# Patient Record
Sex: Female | Born: 1990 | Race: Black or African American | Hispanic: No | Marital: Single | Smoking: Never smoker
Health system: Southern US, Community
[De-identification: ages and names within clinical notes are randomized; demographics above are authoritative.]

## PROBLEM LIST (undated history)

## (undated) ENCOUNTER — Inpatient Hospital Stay: Payer: Self-pay

## (undated) DIAGNOSIS — Z789 Other specified health status: Secondary | ICD-10-CM

## (undated) HISTORY — PX: FOOT SURGERY: SHX648

---
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
## 2013-12-23 ENCOUNTER — Emergency Department (HOSPITAL_COMMUNITY)
Admission: EM | Admit: 2013-12-23 | Discharge: 2013-12-23 | Disposition: A | Discharge status: Home / Self Care | Payer: Medicaid Other | Attending: Emergency Medicine | Admitting: Emergency Medicine

## 2013-12-23 ENCOUNTER — Encounter (HOSPITAL_COMMUNITY): Payer: Self-pay | Admitting: Emergency Medicine

## 2013-12-23 DIAGNOSIS — Z3202 Encounter for pregnancy test, result negative: Secondary | ICD-10-CM | POA: Insufficient documentation

## 2013-12-23 DIAGNOSIS — R51 Headache: Secondary | ICD-10-CM | POA: Diagnosis not present

## 2013-12-23 DIAGNOSIS — R519 Headache, unspecified: Secondary | ICD-10-CM

## 2013-12-23 DIAGNOSIS — G8929 Other chronic pain: Secondary | ICD-10-CM | POA: Diagnosis not present

## 2013-12-23 DIAGNOSIS — N390 Urinary tract infection, site not specified: Secondary | ICD-10-CM | POA: Diagnosis not present

## 2013-12-23 LAB — URINALYSIS, ROUTINE W REFLEX MICROSCOPIC
Bilirubin Urine: NEGATIVE
Glucose, UA: NEGATIVE mg/dL
Ketones, ur: NEGATIVE mg/dL
Nitrite: NEGATIVE
PH: 6.5 (ref 5.0–8.0)
Protein, ur: NEGATIVE mg/dL
SPECIFIC GRAVITY, URINE: 1.02 (ref 1.005–1.030)
Urobilinogen, UA: 0.2 mg/dL (ref 0.0–1.0)

## 2013-12-23 LAB — URINE MICROSCOPIC-ADD ON

## 2013-12-23 LAB — PREGNANCY, URINE: PREG TEST UR: NEGATIVE

## 2013-12-23 MED ORDER — IBUPROFEN 400 MG PO TABS
400.0000 mg | ORAL_TABLET | Freq: Once | ORAL | Status: AC
  Administered 2013-12-23: 400 mg via ORAL
  Filled 2013-12-23: qty 1

## 2013-12-23 MED ORDER — ACETAMINOPHEN 500 MG PO TABS
1000.0000 mg | ORAL_TABLET | Freq: Once | ORAL | Status: AC
  Administered 2013-12-23: 1000 mg via ORAL
  Filled 2013-12-23: qty 2

## 2013-12-23 MED ORDER — CEPHALEXIN 500 MG PO CAPS
500.0000 mg | ORAL_CAPSULE | Freq: Four times a day (QID) | ORAL | Status: DC
Start: 2013-12-23 — End: 2014-01-11

## 2013-12-23 MED ORDER — PROMETHAZINE HCL 25 MG PO TABS: 25.0000 mg | ORAL_TABLET | Freq: Four times a day (QID) | ORAL | Status: DC | PRN

## 2013-12-23 NOTE — ED Provider Notes (Signed)
CSN: [id number]     Arrival date & time 12/23/13  1304 History   First MD Initiated Contact with Patient 12/23/13 1400     Chief Complaint  Patient presents with  . Headache      HPI Per pt, c/o gradual onset and persistence of constant acute flair of her chronic headache since 1800 last night.  Describes the headache as located in her forehead, and per her usual chronic headache pain pattern for the past 1 year. Pt states her PMD "gave me some kind of sinus medicine for a while" which did not relief her headache. Denies headache was sudden or maximal in onset or at any time.  Denies visual changes, no focal motor weakness, no tingling/numbness in extremities, no fevers, no neck pain, no rash.  Per pt, c/o gradual onset and persistence of constant dysuria, urinary frequency and urgency for the past 1 week. Describes her symptoms as "I think I have a UTI." Denies flank pain, no fevers, no abd pain, no N/V/D, no vaginal bleeding/discharge, no rash.    Past Medical History  Diagnosis Date  . Chronic headache    Past Surgical History  Procedure Laterality Date  . Foot surgery Left     History  Substance Use Topics  . Smoking status: Never Smoker   . Smokeless tobacco: Not on file  . Alcohol Use: No    Review of Systems ROS: Statement: All systems negative except as marked or noted in the HPI; Constitutional: Negative for fever and chills. ; ; Eyes: Negative for eye pain, redness and discharge. ; ; ENMT: Negative for ear pain, hoarseness, sore throat, nasal congestion, sinus pressure. ; ; Cardiovascular: Negative for chest pain, palpitations, diaphoresis, dyspnea and peripheral edema. ; ; Respiratory: Negative for cough, wheezing and stridor. ; ; Gastrointestinal: Negative for nausea, vomiting, diarrhea, abdominal pain, blood in stool, hematemesis, jaundice and rectal bleeding. . ; ; Genitourinary: +urinary frequency, dysuria. Negative for flank pain and hematuria. ; ; Musculoskeletal:  Negative for back pain and neck pain. Negative for swelling and trauma.; ; Skin: Negative for pruritus, rash, abrasions, blisters, bruising and skin lesion.; ; Neuro: Negative for headache, lightheadedness and neck stiffness. Negative for weakness, altered level of consciousness , altered mental status, extremity weakness, paresthesias, involuntary movement, seizure and syncope.      Allergies  Review of patient's allergies indicates no known allergies.  Home Medications   Prior to Admission medications   Not on File   BP 118/66  Pulse 86  Temp(Src) 98.4 F (36.9 C) (Oral)  Resp 16  Ht  (1.727 m)  Wt 164 lb (74.39 kg)  BMI 24.94 kg/m2  SpO2 100%  LMP 12/01/2013 Physical Exam 1420: Physical examination:  Nursing notes reviewed; Vital signs and O2 SAT reviewed;  Constitutional: Well developed, Well nourished, Well hydrated, In no acute distress; Head:  Normocephalic, atraumatic; Eyes: EOMI, PERRL, No scleral icterus; ENMT: TM's clear bilat. +edemetous nasal turbinates bilat with clear rhinorrhea. +tender to percuss frontal sinus. Mouth and pharynx normal, Mucous membranes moist; Neck: Supple, Full range of motion, No lymphadenopathy; Cardiovascular: Regular rate and rhythm, No murmur, rub, or gallop; Respiratory: Breath sounds clear & equal bilaterally, No rales, rhonchi, wheezes.  Speaking full sentences with ease, Normal respiratory effort/excursion; Chest: Nontender, Movement normal; Abdomen: Soft, Nontender, Nondistended, Normal bowel sounds; Genitourinary: No CVA tenderness; Extremities: Pulses normal, No tenderness, No edema, No calf edema or asymmetry.; Neuro: AA&Ox3, Major CN grossly intact. No facial droop. Speech clear. No gross  focal motor or sensory deficits in extremities. Climbs on and off stretcher easily by herself. Gait steady.; Skin: Color normal, Warm, Dry.   ED Course  Procedures     MDM  MDM Reviewed: nursing note and vitals Interpretation:  labs    Results for orders placed during the hospital encounter of 12/23/13  URINALYSIS, ROUTINE W REFLEX MICROSCOPIC      Result Value Ref Range   Color, Urine YELLOW  YELLOW   APPearance CLOUDY (*) CLEAR   Specific Gravity, Urine 1.020  1.005 - 1.030   pH 6.5  5.0 - 8.0   Glucose, UA NEGATIVE  NEGATIVE mg/dL   Hgb urine dipstick TRACE (*) NEGATIVE   Bilirubin Urine NEGATIVE  NEGATIVE   Ketones, ur NEGATIVE  NEGATIVE mg/dL   Protein, ur NEGATIVE  NEGATIVE mg/dL   Urobilinogen, UA 0.2  0.0 - 1.0 mg/dL   Nitrite NEGATIVE  NEGATIVE   Leukocytes, UA MODERATE (*) NEGATIVE  PREGNANCY, URINE      Result Value Ref Range   Preg Test, Ur NEGATIVE  NEGATIVE  URINE MICROSCOPIC-ADD ON      Result Value Ref Range   Squamous Epithelial / LPF MANY (*) RARE   WBC, UA 11-20  <3 WBC/hpf   RBC / HPF 0-2  <3 RBC/hpf   Bacteria, UA FEW (*) RARE    1430:  Pt driving herself home; will only tx with tylenol/motrin while in the ED. Pt verb understanding. Will tx UTI with keflex; tx headache symptomatically. Dx and testing d/w pt.  Questions answered.  Verb understanding, agreeable to d/c home with outpt f/u.       Samuel Jester, DO 12/25/13 1627

## 2013-12-23 NOTE — Discharge Instructions (Signed)
°Emergency Department Resource Guide °1) Find a Doctor and Pay Out of Pocket °Although you won't have to find out who is covered by your insurance plan, it is a good idea to ask around and get recommendations. You will then need to call the office and see if the doctor you have chosen will accept you as a new patient and what types of options they offer for patients who are self-pay. Some doctors offer discounts or will set up payment plans for their patients who do not have insurance, but you will need to ask so you aren't surprised when you get to your appointment. ° °2) Contact Your Local Health Department °Not all health departments have doctors that can see patients for sick visits, but many do, so it is worth a call to see if yours does. If you don't know where your local health department is, you can check in your phone book. The CDC also has a tool to help you locate your state's health department, and many state websites also have listings of all of their local health departments. ° °3) Find a Walk-in Clinic °If your illness is not likely to be very severe or complicated, you may want to try a walk in clinic. These are popping up all over the country in pharmacies, drugstores, and shopping centers. They're usually staffed by nurse practitioners or physician assistants that have been trained to treat common illnesses and complaints. They're usually fairly quick and inexpensive. However, if you have serious medical issues or chronic medical problems, these are probably not your best option. ° °No Primary Care Doctor: °- Call [facility] at  832-8000 - they can help you locate a primary care doctor that  accepts your insurance, provides certain services, etc. °- Physician Referral Service- 1-800-533-3463 ° °Chronic Pain Problems: °Organization         Address  Phone   Notes  °[facility]  (336) 297-2271 Patients need to be referred by their primary care doctor.  ° °Medication  Assistance: °Organization         Address  Phone   Notes  °[place] [facility] [address] °[place], [place] [address] (336) 641-8030 --Must be a resident of [place] °-- Must have NO insurance coverage whatsoever (no Medicaid/ Medicare, etc.) °-- The pt. MUST have a primary care doctor that directs their care regularly and follows them in the community °  °[facility]  (866) 331-1348   °[employer]  (888) 892-1162   ° °Agencies that provide inexpensive medical care: °Organization         Address  Phone   Notes  °[facility]  (336) 832-8035   °[facility]    (336) 832-7272   °[facility] [address] °[place], [place] [address] (336) 832-4777   °[facility] [address], °[place] (336) 271-4999   °[facility]    (336) 373-0678   °[facility]    (336) 272-1050   °[facility] ° [address], [place] Phone:  (336) 832-4444, Fax:  (336) 832-4440 Hours of Operation:  9 am - 6 pm, M-F.  Also accepts Medicaid/Medicare and self-pay.  °[facility] ° [address], [place] Phone: (336) 832-3150, Fax: (336) [phone]. Hours of Operation:  8:30 am - 5:30 pm, M-F.  Also accepts Medicaid and self-pay.  °[facility] [place] [address], [place] Phone: (336) 878-6027   °[facility] [address], [place], [place] (336)723-1848, Ext. 123 Mondays & Thursdays: 7-9 AM.  First 15 patients are seen on a first come, first serve basis. °  ° °Medicaid-accepting [place] Providers: ° °Organization         Address  Phone   Notes  °[facility] [address], [place] (336) 641-2100 Also accepts self-pay patients.  °[facility] [address], [place] ° (336) 856-9996   °[facility] [address], [place]  (336) 288-8857   °[facility] [address] [place] [address], [place] (336) 299-7000   °Veita Bland [address], [place]  ° (336) 373-1557 Only accepts [place] Access Medicaid patients after they have their name applied to their card.  ° °Self-Pay (no insurance) in [place]: ° °Organization         Address  Phone   Notes  °Sickle Cell Patients, [facility] [address], [place] (336) 832-1970   °[facility] [address], [place] (336) 832-4400   °[facility] ° [address], [place] (336) 992-4800   °[facility]/Dr. Osei-Bonsu ° [address] [place] [address], [place] or [address], [place] (336) [phone] Phone number for both [place] and [place] locations is the same.  °[facility] [address], [place] (336) 299-0000   °[facility] [address] [place] [address], [place] or [address] (336) 852-7530 °(336) 878-2260   °[facility] [address], [place] (336) 350-1642, phone; (336) 294-5005, fax Sees patients 1st and 3rd Saturday of every month.  Must not qualify for public or private insurance (i.e. Medicaid, Medicare, [place] Health Choice, Veterans' Benefits) • Household income should be no more than 200% of the poverty level •The clinic cannot treat you if you are pregnant or think you are pregnant • Sexually transmitted diseases are not treated at the clinic.  ° ° °Dental Care: °Organization         Address  Phone  Notes  °[place] [facility] [address], [place] (336) 641-6152 Accepts children up to age 21 who are enrolled in Medicaid or [place] Health Choice; pregnant women with a Medicaid card; and children who have applied for Medicaid or [place] Health Choice, but were declined, whose parents can pay a reduced fee at time of service.  °[place]  [facility] [place]  [address], [place] (336) 641-7733 Accepts children up to age 21 who are enrolled in Medicaid or [place] Health Choice; pregnant women with a Medicaid card; and children who have applied for Medicaid or [place] Health Choice, but were declined, whose parents can pay a reduced fee at time of service.  °[facility] ° [address], [place] (336) 641-4533 Patients are seen by appointment only. Walk-ins are not accepted. [facility] will see patients 18 years of age and older. °Monday - Tuesday (8am-5pm) °Most Wednesdays (8:30-5pm) °$30 per visit, cash only  °[facility] ° [address], [place] (336) 641-4533 Patients are seen by appointment only. Walk-ins are not accepted. [facility] will see patients 18 years of age and older. °One   Wednesday Evening (Monthly: Volunteer Based).  $30 per visit, cash only  °[facility]  (919) 537-3737 for adults; Children under age 4, call Graduate Pediatric Dentistry at ([phone]) [phone]. Children aged 4-14, please call (919) [phone] to request a pediatric application. ° Dental services are provided in all areas of dental care including fillings, crowns and bridges, complete and partial dentures, implants, gum treatment, root canals, and extractions. Preventive care is also provided. Treatment is provided to both adults and children. °Patients are selected via a lottery and there is often a waiting list. °  °[facility] [address], °[place] ° (336) 763-8833 [email or web address] °  °[facility] [address], [place], [place] (336)723-1848, Ext. 123 Second and Fourth Thursday of each month, opens at 6:30 AM; Clinic ends at 9 AM.  Patients are seen on a first-come first-served basis, and a limited number are seen during each clinic.  ° °[facility] ° [address], [place], [place] (336) 723-7904    Eligibility Requirements °You must have lived in [place], [place], or [place] counties for at least the last three months. °  You cannot be eligible for state or federal sponsored healthcare insurance, including Veterans Administration, Medicaid, or Medicare. °  You generally cannot be eligible for healthcare insurance through your employer.  °  How to apply: °Eligibility screenings are held every Tuesday and Wednesday afternoon from 1:00 pm until 4:00 pm. You do not need an appointment for the interview!  °[facility] [address], [place], [place] 336-631-2330   °[facility]  336-342-8273   °[facility]  336-703-3100   °[facility]  336-570-6415   ° °Behavioral Health Resources in the Community: °Intensive Outpatient Programs °Organization         Address  Phone  Notes  °[facility] [address], [place], [place] 336-878-6098   °[facility] [address], [place], [place] 336-832-9800   °[facility] [address], [place], [place] ° 336-882-2125   °[facility] [address],  °[place], [place] 1-800-853-5163 or 336-641-4981   °Substance Abuse Resources °Organization         Address  Phone  Notes  °[facility]  336-882-2125   °[facility]  336-784-9470   °[facility]  336-285-9073   °[facility]  336-845-3988   °Residential & Outpatient Substance Abuse Program  1-800-659-3381   °Psychological Services °Organization         Address  Phone  Notes  °[facility]  336- 832-9600   °[facility]  336- 378-7881   °[facility] [address], [place] 1-800-853-5163 or 336-641-4981   ° °Mobile Crisis Teams °Organization         Address  Phone  Notes  °[facility], Mobile Crisis Care Unit  1-877-626-1772   °Assertive °[facility] ° [address]  [place], [place] 336-834-9664   °Sharon DeEsch [address] °[place] 336-554-5454   ° °Self-Help/Support Groups °Organization         Address  Phone             Notes  °[facility] - variety of support groups  336- 373-1402 Call for more information  °Narcotics Anonymous (NA), [facility] [address], °[place]  2 meetings at this location  ° °  Residential Treatment Programs °Organization         Address  Phone  Notes  °[facility] [address],    °[place]  1-866-801-8205   °[facility] ° [address], [place], [place] 704-293-8524   °[facility] [address], [place] 336-845-3988 Admissions: 8am-3pm M-F  °[facility] [address]    °[place], [place] 336-841-1104   °[facility] [address], [place], [place] 336-379-7146   °[facility] [address]  °[place], [place] 336-285-9073   °[facility] - Intensive Outpatient [address], [place], [place] 336-852-3033   °[facility] [address]  °[place], [place] 1-877-615-2722 or 336-784-9470   °[facility] [address] [place], [place] 336-227-7417 Accepts Medicaid  °[facility] [address]  °[place] 1-800-659-3381 Substance Abuse/Addiction Treatment  ° °[place] Behavioral Health Resources °Organization         Address  Phone  Notes  °[facility]  (888) 581-9988   °Julie Brannon, PhD [address] [place], [place]   (336) 349-5553 or (336) 951-0000   °[facility]   [address] °[place], [place] (336) 349-4454   °[facility] [address], [place], [place] (336) 342-8316 Insurance/Medicaid/sponsorship through [facility]  °[facility] [address]                                    [place], [place] (336) 342-8316 Therapy/tele-psych/case    °[facility] [address]  ° [place], [place] (336) 349-2233    °Dr. Arfeen  (336) 349-4544   °[facility] [place]  [employer] [place] [facility] 1) [address], [place] °2) [address], [place] °3)  [address], [place] (336) 349-3220 °(336) 342-7768 ° °(336) 342-8140   °[place] [facility] (336) 342-1394 or (336) 342-3537 (After Hours)    ° ° °Take over the counter tylenol and ibuprofen (OR excedrin) and benadryl, as directed on packaging, with the prescription given to you today, as needed for headache.  Keep a headache diary. Call your regular medical doctor on Monday to schedule a follow up appointment within the next 3 days.  Return to the Emergency Department immediately sooner if worsening.  ° °

## 2013-12-23 NOTE — ED Notes (Signed)
Pt reports headache since last night. Pt reports light sensitivity. Pt also reports lower back,urinary frequency x1 week. nad noted.

## 2014-01-11 ENCOUNTER — Encounter (HOSPITAL_COMMUNITY): Payer: Self-pay | Admitting: Emergency Medicine

## 2014-01-11 ENCOUNTER — Emergency Department (HOSPITAL_COMMUNITY)
Admission: EM | Admit: 2014-01-11 | Discharge: 2014-01-11 | Disposition: A | Discharge status: Home / Self Care | Payer: Medicaid Other | Attending: Emergency Medicine | Admitting: Emergency Medicine

## 2014-01-11 DIAGNOSIS — N898 Other specified noninflammatory disorders of vagina: Secondary | ICD-10-CM | POA: Diagnosis present

## 2014-01-11 DIAGNOSIS — Z792 Long term (current) use of antibiotics: Secondary | ICD-10-CM | POA: Insufficient documentation

## 2014-01-11 DIAGNOSIS — B3731 Acute candidiasis of vulva and vagina: Secondary | ICD-10-CM | POA: Insufficient documentation

## 2014-01-11 DIAGNOSIS — B373 Candidiasis of vulva and vagina: Secondary | ICD-10-CM

## 2014-01-11 DIAGNOSIS — G8929 Other chronic pain: Secondary | ICD-10-CM | POA: Diagnosis not present

## 2014-01-11 LAB — WET PREP, GENITAL
TRICH WET PREP: NONE SEEN
Yeast Wet Prep HPF POC: NONE SEEN

## 2014-01-11 MED ORDER — NYSTATIN-TRIAMCINOLONE 100000-0.1 UNIT/GM-% EX CREA: TOPICAL_CREAM | CUTANEOUS | Status: DC

## 2014-01-11 MED ORDER — FLUCONAZOLE 100 MG PO TABS
150.0000 mg | ORAL_TABLET | Freq: Once | ORAL | Status: AC
  Administered 2014-01-11: 150 mg via ORAL
  Filled 2014-01-11: qty 2

## 2014-01-11 NOTE — ED Notes (Signed)
Vaginal d/c for 1 week, itches, onset after taking antibiotic for uti.

## 2014-01-11 NOTE — Discharge Instructions (Signed)

## 2014-01-11 NOTE — ED Provider Notes (Signed)
CSN: [id number]     Arrival date & time 01/11/14  1937 History   First MD Initiated Contact with Patient 01/11/14 1943     No chief complaint on file.    (Consider location/radiation/quality/duration/timing/severity/associated sxs/prior Treatment) Patient is a 23 y.o. female presenting with vaginal discharge. The history is provided by the patient.  Vaginal Discharge Quality:  Thick and white Severity:  Moderate Onset quality:  Gradual Duration:  1 week Timing:  Constant Progression:  Worsening Chronicity:  New Context: spontaneously   Relieved by:  Nothing Worsened by:  Nothing tried Ineffective treatments:  None tried Risk factors comment:  Recent antibiotics  Kimberly Moore is a 23 y.o. female who presents to the ED with vaginal discharge after she was treated with antibiotics for a UTI. She denies any other problems tonight. UTI symptoms have resolved   Past Medical History  Diagnosis Date  . Chronic headache    Past Surgical History  Procedure Laterality Date  . Foot surgery Left    History reviewed. No pertinent family history. History  Substance Use Topics  . Smoking status: Never Smoker   . Smokeless tobacco: Not on file  . Alcohol Use: No   OB History   Grav Para Term Preterm Abortions TAB SAB Ect Mult Living                 Review of Systems  Genitourinary: Positive for vaginal discharge.      Allergies  Review of patient's allergies indicates no known allergies.  Home Medications   Prior to Admission medications   Medication Sig Start Date End Date Taking? Authorizing Provider  cephALEXin (KEFLEX) 500 MG capsule Take 1 capsule (500 mg total) by mouth 4 (four) times daily. 12/23/13   Samuel Jester, DO  promethazine (PHENERGAN) 25 MG tablet Take 1 tablet (25 mg total) by mouth every 6 (six) hours as needed for nausea or vomiting (or headache). 12/23/13   Samuel Jester, DO   BP 124/81  Pulse 74  Temp(Src) 98.1 F (36.7 C) (Oral)  Resp 20   Ht  (1.727 m)  Wt 162 lb (73.483 kg)  BMI 24.64 kg/m2  SpO2 100%  LMP 11/30/2013 Physical Exam  Nursing note and vitals reviewed. Constitutional: She is oriented to person, place, and time. She appears well-developed and well-nourished.  HENT:  Head: Normocephalic.  Eyes: EOM are normal.  Neck: Neck supple.  Cardiovascular: Normal rate.   Pulmonary/Chest: Effort normal.  Abdominal: Soft. There is no tenderness.  Genitourinary:  External genitalia with erythema and dry patchy areas. Thick white discharge vaginal vault. No CMT, no adnexal tenderness, uterus without palpable enlargement.   Musculoskeletal: Normal range of motion.  Neurological: She is alert and oriented to person, place, and time. No cranial nerve deficit.  Skin: Skin is warm and dry.  Psychiatric: She has a normal mood and affect. Her behavior is normal.    ED Course  Procedures  Results for orders placed during the hospital encounter of 01/11/14 (from the past 24 hour(s))  WET PREP, GENITAL     Status: Abnormal   Collection Time    01/11/14  8:20 PM      Result Value Ref Range   Yeast Wet Prep HPF POC NONE SEEN  NONE SEEN   Trich, Wet Prep NONE SEEN  NONE SEEN   Clue Cells Wet Prep HPF POC FEW (*) NONE SEEN   WBC, Wet Prep HPF POC FEW (*) NONE SEEN    MDM  22  y.o. female with vaginal itching and discharge after taking 10 days of antibiotics for a UTI. Clinical findings consistent with yeast although none seen on the wet prep. Will treat clinical findings. Discussed with the patient and all questioned fully answered. She will return if any problems arise.    Medication List    TAKE these medications       nystatin-triamcinolone cream  Commonly known as:  MYCOLOG II  Apply to affected area daily      ASK your doctor about these medications       promethazine 25 MG tablet  Commonly known as:  PHENERGAN  Take 1 tablet (25 mg total) by mouth every 6 (six) hours as needed for nausea or vomiting (or  headache).           [place], [place] 01/11/14 2049

## 2014-01-13 LAB — GC/CHLAMYDIA PROBE AMP
CT Probe RNA: POSITIVE — AB
GC Probe RNA: NEGATIVE

## 2014-01-14 ENCOUNTER — Telehealth (HOSPITAL_COMMUNITY): Payer: Self-pay

## 2014-01-14 NOTE — ED Notes (Signed)
Positive for Chlamydia.  Chart sent to EDP office for review 

## 2014-01-17 ENCOUNTER — Telehealth (HOSPITAL_BASED_OUTPATIENT_CLINIC_OR_DEPARTMENT_OTHER): Payer: Self-pay | Admitting: Emergency Medicine

## 2014-01-17 NOTE — Telephone Encounter (Signed)
Post ED Visit - Positive Culture Follow-up: Successful Patient Follow-Up  Culture assessed and recommendations reviewed by:  Wes Dulaney, Pharm.D., BCPS  Celedonio Miyamoto, Pharm.D., BCPS  Georgina Pillion, Pharm.D., BCPS  [place], [address].D., BCPS, AAHIVP  Estella Husk, Pharm.D., BCPS, AAHIVP  Red Christians, Pharm.D.  Cassie Roseanne Ren[place], [place].D.  Positive chlamydia culture   Patient discharged without antimicrobial prescription and treatment is now indicated  Organism is resistant to prescribed ED discharge antimicrobial  Patient with positive blood cultures  Changes discussed with ED provider: Mellody Drown PA New antibiotic prescription Azithromycin  po x 1 Called to [facility]   Contacted patient, 01/17/14 0935   Berle Mull 01/17/2014, 9:38 AM

## 2014-01-17 NOTE — Telephone Encounter (Signed)
Post ED Visit - Positive Culture Follow-up: Successful Patient Follow-Up  Culture assessed and recommendations reviewed by:  Wes Dulaney, Pharm.D., BCPS  Celedonio Miyamoto, Pharm.D., BCPS  Georgina Pillion, Pharm.D., BCPS  [place], [address].D., BCPS, AAHIVP  Estella Husk, Pharm.D., BCPS, AAHIVP  Red Christians, Pharm.D.  Tennis Must, Pharm.D.  Positive chlamydiaculture   Patient discharged without antimicrobial prescription and treatment is now indicated  Organism is resistant to prescribed ED discharge antimicrobial  Patient with positive blood cultures  Changes discussed with ED provider: Mellody Drown PA  New antibiotic prescription Azithromycin  po x one  Called to [facility] [phone]  Contacted patient, date 01/17/14  0935   Berle Mull 01/17/2014, 9:35 AM

## 2014-01-18 NOTE — ED Provider Notes (Signed)
Medical screening examination/treatment/procedure(s) were performed by non-physician practitioner and as supervising physician I was immediately available for consultation/collaboration.   EKG Interpretation None        Tanya Crothers, MD 01/18/14 0755 

## 2015-04-08 ENCOUNTER — Emergency Department (HOSPITAL_COMMUNITY)
Admission: EM | Admit: 2015-04-08 | Discharge: 2015-04-08 | Disposition: A | Discharge status: Home / Self Care | Payer: 59 | Attending: Emergency Medicine | Admitting: Emergency Medicine

## 2015-04-08 ENCOUNTER — Encounter (HOSPITAL_COMMUNITY): Payer: Self-pay | Admitting: Emergency Medicine

## 2015-04-08 DIAGNOSIS — G8929 Other chronic pain: Secondary | ICD-10-CM | POA: Diagnosis not present

## 2015-04-08 DIAGNOSIS — R519 Headache, unspecified: Secondary | ICD-10-CM

## 2015-04-08 DIAGNOSIS — Z79899 Other long term (current) drug therapy: Secondary | ICD-10-CM | POA: Insufficient documentation

## 2015-04-08 DIAGNOSIS — R51 Headache: Secondary | ICD-10-CM | POA: Insufficient documentation

## 2015-04-08 DIAGNOSIS — H53149 Visual discomfort, unspecified: Secondary | ICD-10-CM | POA: Insufficient documentation

## 2015-04-08 DIAGNOSIS — R5383 Other fatigue: Secondary | ICD-10-CM | POA: Insufficient documentation

## 2015-04-08 DIAGNOSIS — G478 Other sleep disorders: Secondary | ICD-10-CM | POA: Insufficient documentation

## 2015-04-08 MED ORDER — BUTALBITAL-APAP-CAFFEINE 50-325-40 MG PO TABS: 1.0000 | ORAL_TABLET | Freq: Four times a day (QID) | ORAL | Status: AC | PRN

## 2015-04-08 NOTE — ED Provider Notes (Signed)
CSN: [id number]     Arrival date & time 04/08/15  1515 History  By signing my name below, I, Placido SouLogan Joldersma, attest that this documentation has been prepared under the direction and in the presence of Burgess AmorJulie Ladean Steinmeyer, PA-C. Electronically Signed: Placido SouLogan Joldersma, ED Scribe. 04/08/2015. 4:35 PM.   Chief Complaint  Patient presents with  . Headache    since Friday   The history is provided by the patient. No language interpreter was used.    HPI Comments: Kimberly Moore is a [age] y.o. female with a hx of chronic HA who presents to the Emergency Department complaining of constant, moderate, left sided HA with onset 3 days ago. Pt notes that she typically gets HA's 3x per month that last multiple days. She describes her pain as a pressure. She notes some associated, mild, lightheadedness, trouble sleeping due to pain, fatigue and photophobia. Pt notes taking 4x ibuprofen at 12:00 pm as well as [employer] intermittently as needed further noting the ibuprofen provided relief for 1 hour. She notes a hx of right sided dental pain. Pt denies any known drug allergies, other medical conditions or a FHx of  migraines. Pt denies wearing rx eyewear. She denies having seen a neurologist for her symptoms. Pt denies a hx of neck injury. She notes her LNMP was 1.5 weeks ago. She denies n/v, dizziness, neck pain, sore throat, trouble swallowing, noise sensitivity, focal weakness, or any recent cold-like symptoms.   Past Medical History  Diagnosis Date  . Chronic headache    Past Surgical History  Procedure Laterality Date  . Foot surgery Left    History reviewed. No pertinent family history. Social History  Substance Use Topics  . Smoking status: Never Smoker   . Smokeless tobacco: None  . Alcohol Use: No   OB History    No data available     Review of Systems  Constitutional: Positive for fatigue.  Eyes: Positive for photophobia.  Neurological: Positive for light-headedness and headaches.   Psychiatric/Behavioral: Positive for sleep disturbance.   Allergies  Review of patient's allergies indicates no known allergies.  Home Medications   Prior to Admission medications   Medication Sig Start Date End Date Taking? Authorizing Provider  butalbital-acetaminophen-caffeine (FIORICET) [phone] MG tablet Take 1-2 tablets by mouth every 6 (six) hours as needed for headache. 04/08/15 04/07/16  Burgess AmorJulie Ardon Franklin, PA-C  nystatin-triamcinolone (MYCOLOG II) cream Apply to affected area daily 01/11/14   [facility] Orlene OchM Neese, NP  promethazine (PHENERGAN) 25 MG tablet Take 1 tablet (25 mg total) by mouth every 6 (six) hours as needed for nausea or vomiting (or headache). 12/23/13   Samuel JesterKathleen McManus, DO   BP 104/58 mmHg  Pulse 79  Temp(Src) 98.6 F (37 C) (Oral)  Resp 14  Ht 5\' 8"  (1.727 m)  Wt 78.019 kg  BMI 26.16 kg/m2  SpO2 100%  LMP 04/01/2015 Physical Exam  Constitutional: She is oriented to person, place, and time. She appears well-developed and well-nourished.  HENT:  Head: Normocephalic and atraumatic.  Left Ear: External ear normal.  Mouth/Throat: Oropharynx is clear and moist. Normal dentition. No oropharyngeal exudate.  Right TM is slightly injected without loss of landmarks   Eyes: EOM are normal. Pupils are equal, round, and reactive to light.  Neck: Normal range of motion. Neck supple. No tracheal deviation present.  Tightness to trapezius muscles bilaterally   Cardiovascular: Normal rate and normal heart sounds.   Pulmonary/Chest: Effort normal. No respiratory distress.  Musculoskeletal: Normal range of motion.  Lymphadenopathy:    She has no cervical adenopathy.  Neurological: She is alert and oriented to person, place, and time. She has normal strength. No cranial nerve deficit or sensory deficit. Gait normal. GCS eye subscore is 4. GCS verbal subscore is 5. GCS motor subscore is 6.  Negative pronator drift; normal rapid alternating movements   Skin: Skin is warm and dry. No  rash noted. She is not diaphoretic.  Psychiatric: She has a normal mood and affect. Her speech is normal and behavior is normal. Thought content normal. Cognition and memory are normal.  Nursing note and vitals reviewed.  ED Course  Procedures  DIAGNOSTIC STUDIES: Oxygen Saturation is 100% on RA, normal by my interpretation.    COORDINATION OF CARE: 4:32 PM Pt presents today due to a left sided HA. Discussed next steps with pt including an rx for Fioricet and a referral to a neurologist. Pt agreed to plan.   Labs Review Labs Reviewed - No data to display  Imaging Review No results found.   EKG Interpretation None      MDM   Final diagnoses:  Generalized headache    Suspect tension vs possible migraine.  Headache does not seem to coincide with menses.  No neuro deficit on exam, no red flag signs of intracranial pathology.  Pt advised she should have neuro eval given persistence of headache, referral given for this.  She was placed on fioricet to try for headache tx prn.    I personally performed the services described in this documentation, which was scribed in my presence. The recorded information has been reviewed and is accurate.   Burgess Amor, PA-C 04/10/15 1406  Vanetta Mulders, MD 04/11/15 1321

## 2015-04-08 NOTE — Discharge Instructions (Signed)

## 2015-04-08 NOTE — ED Notes (Signed)
Pressure to left side of head.  C/o headache, rates pain 8/10.  Took OTC meds with mild relief.  C/o pressure to back of head and behind left ear.

## 2015-04-08 NOTE — ED Notes (Signed)
Pt seen and evaluated by EDPa for initial assessment. 

## 2015-04-11 ENCOUNTER — Emergency Department (HOSPITAL_COMMUNITY): Payer: 59

## 2015-04-11 ENCOUNTER — Emergency Department (HOSPITAL_COMMUNITY)
Admission: EM | Admit: 2015-04-11 | Discharge: 2015-04-12 | Disposition: A | Discharge status: Home / Self Care | Payer: 59 | Attending: Emergency Medicine | Admitting: Emergency Medicine

## 2015-04-11 ENCOUNTER — Encounter (HOSPITAL_COMMUNITY): Payer: Self-pay | Admitting: Emergency Medicine

## 2015-04-11 DIAGNOSIS — H53149 Visual discomfort, unspecified: Secondary | ICD-10-CM | POA: Insufficient documentation

## 2015-04-11 DIAGNOSIS — R51 Headache: Secondary | ICD-10-CM | POA: Diagnosis present

## 2015-04-11 DIAGNOSIS — M542 Cervicalgia: Secondary | ICD-10-CM | POA: Diagnosis not present

## 2015-04-11 DIAGNOSIS — R42 Dizziness and giddiness: Secondary | ICD-10-CM | POA: Diagnosis not present

## 2015-04-11 DIAGNOSIS — G8929 Other chronic pain: Secondary | ICD-10-CM | POA: Insufficient documentation

## 2015-04-11 DIAGNOSIS — R5383 Other fatigue: Secondary | ICD-10-CM | POA: Diagnosis not present

## 2015-04-11 DIAGNOSIS — R519 Headache, unspecified: Secondary | ICD-10-CM

## 2015-04-11 MED ORDER — KETOROLAC TROMETHAMINE 10 MG PO TABS
10.0000 mg | ORAL_TABLET | Freq: Once | ORAL | Status: AC
  Administered 2015-04-11: 10 mg via ORAL
  Filled 2015-04-11: qty 1

## 2015-04-11 MED ORDER — HYDROCODONE-ACETAMINOPHEN 5-325 MG PO TABS
2.0000 | ORAL_TABLET | Freq: Once | ORAL | Status: AC
  Administered 2015-04-11: 2 via ORAL
  Filled 2015-04-11: qty 2

## 2015-04-11 MED ORDER — PROCHLORPERAZINE MALEATE 5 MG PO TABS
10.0000 mg | ORAL_TABLET | Freq: Four times a day (QID) | ORAL | Status: DC | PRN
  Administered 2015-04-11: 10 mg via ORAL
  Filled 2015-04-11: qty 2

## 2015-04-11 NOTE — ED Notes (Signed)
Pt c/o having continuous headache since the weekend.

## 2015-04-12 MED ORDER — HYDROCODONE-ACETAMINOPHEN 5-325 MG PO TABS: 1.0000 | ORAL_TABLET | ORAL | Status: DC | PRN

## 2015-04-12 MED ORDER — IBUPROFEN 600 MG PO TABS: 600.0000 mg | ORAL_TABLET | Freq: Four times a day (QID) | ORAL | Status: DC | PRN

## 2015-04-12 NOTE — ED Notes (Signed)
Pt awake, alert.  No distress.  No complaints at present.

## 2015-04-12 NOTE — ED Notes (Signed)
Pt continues to sleep. No distress noted.

## 2015-04-12 NOTE — Discharge Instructions (Signed)
Your vital signs are within normal limits. Your CT scan is negative for acute changes or problem. Please see Dr Gerilyn Pilgrimoonquah for neurologic evaluation of your headaches. Use ibuprofen with each meal and at bedtime. Use norco for more severe pain. Norco may cause drowsiness, use with caution.

## 2015-04-12 NOTE — ED Notes (Signed)
Pt continues to rest. No distress noted.

## 2015-04-12 NOTE — ED Notes (Signed)
Pt unable to obtain ride following narcotic medications.  Pt will be discharged, but will sleep in room for a while till narcotic effect has diminished.

## 2015-04-15 NOTE — ED Provider Notes (Signed)
CSN: [id number]     Arrival date & time 04/11/15  2137 History   First MD Initiated Contact with Patient 04/11/15 2218     Chief Complaint  Patient presents with  . Headache     (Consider location/radiation/quality/duration/timing/severity/associated sxs/prior Treatment) Patient is a 24 y.o. female presenting with headaches. The history is provided by the patient.  Headache Pain location:  L parietal Quality: pressure at times, dull at times. Radiates to:  L neck Severity currently:  8/10 Onset quality:  Gradual Duration:  6 days Timing:  Intermittent Progression:  Unchanged Chronicity:  Recurrent Similar to prior headaches: yes   Context comment:  Fatigue, lightheadedness. Relieved by:  Nothing Worsened by:  Light and activity Ineffective treatments:  Acetaminophen Associated symptoms: fatigue, neck pain and photophobia   Associated symptoms: no abdominal pain, no fever, no nausea, no near-syncope, no numbness, no syncope and no weakness   Risk factors: insomnia   Risk factors: no family hx of SAH     Past Medical History  Diagnosis Date  . Chronic headache    Past Surgical History  Procedure Laterality Date  . Foot surgery Left    No family history on file. Social History  Substance Use Topics  . Smoking status: Never Smoker   . Smokeless tobacco: None  . Alcohol Use: No   OB History    No data available     Review of Systems  Constitutional: Positive for fatigue. Negative for fever.  Eyes: Positive for photophobia.  Cardiovascular: Negative for syncope and near-syncope.  Gastrointestinal: Negative for nausea and abdominal pain.  Musculoskeletal: Positive for neck pain.  Neurological: Positive for light-headedness and headaches. Negative for weakness and numbness.  All other systems reviewed and are negative.     Allergies  Review of patient's allergies indicates no known allergies.  Home Medications   Prior to Admission medications   Medication  Sig Start Date End Date Taking? Authorizing Provider  butalbital-acetaminophen-caffeine (FIORICET) 50-325-40 MG tablet Take 1-2 tablets by mouth every 6 (six) hours as needed for headache. 04/08/15 04/07/16 Yes Burgess Amor, PA-C  HYDROcodone-acetaminophen (NORCO/VICODIN) 5-325 MG tablet Take 1 tablet by mouth every 4 (four) hours as needed. 04/12/15   Ivery Quale, PA-C  ibuprofen (ADVIL,MOTRIN) 600 MG tablet Take 1 tablet (600 mg total) by mouth every 6 (six) hours as needed. 04/12/15   Ivery Quale, PA-C  nystatin-triamcinolone (MYCOLOG II) cream Apply to affected area daily Patient not taking: Reported on 04/11/2015 01/11/14   Janne Napoleon, NP  promethazine (PHENERGAN) 25 MG tablet Take 1 tablet (25 mg total) by mouth every 6 (six) hours as needed for nausea or vomiting (or headache). Patient not taking: Reported on 04/11/2015 12/23/13   Samuel Jester, DO   BP 125/77 mmHg  Pulse 79  Temp(Src) 98.1 F (36.7 C) (Oral)  Resp 12  SpO2 100%  LMP 04/01/2015 Physical Exam  Constitutional: She is oriented to person, place, and time. She appears well-developed and well-nourished.  Non-toxic appearance.  HENT:  Head: Normocephalic.  Right Ear: Tympanic membrane and external ear normal.  Left Ear: Tympanic membrane and external ear normal.  Eyes: EOM and lids are normal. Pupils are equal, round, and reactive to light.  Neck: Normal range of motion. Neck supple. Carotid bruit is not present.  Cardiovascular: Normal rate, regular rhythm, normal heart sounds, intact distal pulses and normal pulses.   Pulmonary/Chest: Breath sounds normal. No respiratory distress.  Abdominal: Soft. Bowel sounds are normal. There is no tenderness. There  is no guarding.  Musculoskeletal: Normal range of motion.  Lymphadenopathy:       Head (right side): No submandibular adenopathy present.       Head (left side): No submandibular adenopathy present.    She has no cervical adenopathy.  Neurological: She is alert  and oriented to person, place, and time. She has normal strength. No cranial nerve deficit or sensory deficit. She exhibits normal muscle tone. Coordination normal.  Gait and balance intact.  Skin: Skin is warm and dry.  Psychiatric: She has a normal mood and affect. Her speech is normal.  Nursing note and vitals reviewed.   ED Course  Procedures (including critical care time) Labs Review Labs Reviewed - No data to display  Imaging Review No results found. I have personally reviewed and evaluated these images and lab results as part of my medical decision-making.   EKG Interpretation None      MDM  Pt reports improvement in pain with norco and toradol and compazine. CT scan is negative for acute findings or changes. Repeat evaluation reveals no acute neuro changes or problem.  Pt now states that the ride she call can not pick her up.  Pt will be kept in ED until ride available.   Final diagnoses:  Left-sided headache    *I have reviewed nursing notes, vital signs, and all appropriate lab and imaging results for this patient.Ivery Quale**    Keelee Yankey, PA-C 04/15/15 1949  Marily MemosJason Mesner, MD 04/15/15 [phone]

## 2015-07-16 DIAGNOSIS — R309 Painful micturition, unspecified: Secondary | ICD-10-CM | POA: Diagnosis not present

## 2015-07-16 DIAGNOSIS — R809 Proteinuria, unspecified: Secondary | ICD-10-CM | POA: Diagnosis not present

## 2015-10-02 DIAGNOSIS — N898 Other specified noninflammatory disorders of vagina: Secondary | ICD-10-CM | POA: Diagnosis not present

## 2015-10-02 DIAGNOSIS — N760 Acute vaginitis: Secondary | ICD-10-CM | POA: Diagnosis not present

## 2015-10-02 DIAGNOSIS — Z3201 Encounter for pregnancy test, result positive: Secondary | ICD-10-CM | POA: Diagnosis not present

## 2015-10-02 DIAGNOSIS — Z113 Encounter for screening for infections with a predominantly sexual mode of transmission: Secondary | ICD-10-CM | POA: Diagnosis not present

## 2015-10-08 DIAGNOSIS — O23591 Infection of other part of genital tract in pregnancy, first trimester: Secondary | ICD-10-CM | POA: Diagnosis not present

## 2015-10-08 DIAGNOSIS — N760 Acute vaginitis: Secondary | ICD-10-CM | POA: Diagnosis not present

## 2015-10-08 DIAGNOSIS — O200 Threatened abortion: Secondary | ICD-10-CM | POA: Diagnosis not present

## 2015-10-08 DIAGNOSIS — Z3A01 Less than 8 weeks gestation of pregnancy: Secondary | ICD-10-CM | POA: Diagnosis not present

## 2015-10-09 DIAGNOSIS — Z202 Contact with and (suspected) exposure to infections with a predominantly sexual mode of transmission: Secondary | ICD-10-CM | POA: Diagnosis not present

## 2015-11-07 ENCOUNTER — Encounter (HOSPITAL_COMMUNITY): Payer: Self-pay | Admitting: Cardiology

## 2015-11-07 ENCOUNTER — Emergency Department (HOSPITAL_COMMUNITY)
Admission: EM | Admit: 2015-11-07 | Discharge: 2015-11-07 | Disposition: A | Discharge status: Home / Self Care | Payer: 59 | Attending: Emergency Medicine | Admitting: Emergency Medicine

## 2015-11-07 DIAGNOSIS — R69 Illness, unspecified: Secondary | ICD-10-CM | POA: Diagnosis not present

## 2015-11-07 DIAGNOSIS — Z791 Long term (current) use of non-steroidal anti-inflammatories (NSAID): Secondary | ICD-10-CM | POA: Diagnosis not present

## 2015-11-07 DIAGNOSIS — Z3A11 11 weeks gestation of pregnancy: Secondary | ICD-10-CM | POA: Diagnosis not present

## 2015-11-07 DIAGNOSIS — E162 Hypoglycemia, unspecified: Secondary | ICD-10-CM | POA: Diagnosis not present

## 2015-11-07 DIAGNOSIS — R109 Unspecified abdominal pain: Secondary | ICD-10-CM | POA: Diagnosis not present

## 2015-11-07 DIAGNOSIS — O2341 Unspecified infection of urinary tract in pregnancy, first trimester: Secondary | ICD-10-CM | POA: Insufficient documentation

## 2015-11-07 DIAGNOSIS — E161 Other hypoglycemia: Secondary | ICD-10-CM

## 2015-11-07 DIAGNOSIS — O210 Mild hyperemesis gravidarum: Secondary | ICD-10-CM

## 2015-11-07 DIAGNOSIS — N390 Urinary tract infection, site not specified: Secondary | ICD-10-CM

## 2015-11-07 DIAGNOSIS — O2690 Pregnancy related conditions, unspecified, unspecified trimester: Secondary | ICD-10-CM | POA: Diagnosis not present

## 2015-11-07 DIAGNOSIS — Z79899 Other long term (current) drug therapy: Secondary | ICD-10-CM | POA: Diagnosis not present

## 2015-11-07 DIAGNOSIS — O26891 Other specified pregnancy related conditions, first trimester: Secondary | ICD-10-CM | POA: Diagnosis present

## 2015-11-07 LAB — CBC WITH DIFFERENTIAL/PLATELET
Basophils Absolute: 0 10*3/uL (ref 0.0–0.1)
Basophils Relative: 0 %
EOS PCT: 1 %
Eosinophils Absolute: 0 10*3/uL (ref 0.0–0.7)
HCT: 34.5 % — ABNORMAL LOW (ref 36.0–46.0)
Hemoglobin: 11.6 g/dL — ABNORMAL LOW (ref 12.0–15.0)
LYMPHS PCT: 23 %
Lymphs Abs: 1.6 10*3/uL (ref 0.7–4.0)
MCH: 29.7 pg (ref 26.0–34.0)
MCHC: 33.6 g/dL (ref 30.0–36.0)
MCV: 88.2 fL (ref 78.0–100.0)
MONO ABS: 0.3 10*3/uL (ref 0.1–1.0)
Monocytes Relative: 4 %
Neutro Abs: 4.8 10*3/uL (ref 1.7–7.7)
Neutrophils Relative %: 72 %
Platelets: 206 10*3/uL (ref 150–400)
RBC: 3.91 MIL/uL (ref 3.87–5.11)
RDW: 12.8 % (ref 11.5–15.5)
WBC: 6.6 10*3/uL (ref 4.0–10.5)

## 2015-11-07 LAB — URINE MICROSCOPIC-ADD ON

## 2015-11-07 LAB — BASIC METABOLIC PANEL
ANION GAP: 5 (ref 5–15)
BUN: 8 mg/dL (ref 6–20)
CO2: 23 mmol/L (ref 22–32)
Calcium: 8.6 mg/dL — ABNORMAL LOW (ref 8.9–10.3)
Chloride: 107 mmol/L (ref 101–111)
Creatinine, Ser: 0.49 mg/dL (ref 0.44–1.00)
GFR calc Af Amer: >60 mL/min (ref >60)
GFR calc non Af Amer: >60 mL/min (ref >60)
Glucose, Bld: 96 mg/dL (ref 65–99)
POTASSIUM: 3.7 mmol/L (ref 3.5–5.1)
Sodium: 135 mmol/L (ref 135–145)

## 2015-11-07 LAB — URINALYSIS, ROUTINE W REFLEX MICROSCOPIC
Bilirubin Urine: NEGATIVE
GLUCOSE, UA: NEGATIVE mg/dL
Ketones, ur: 40 mg/dL — AB
Nitrite: POSITIVE — AB
PH: 8 (ref 5.0–8.0)
Protein, ur: 30 mg/dL — AB
Specific Gravity, Urine: 1.02 (ref 1.005–1.030)

## 2015-11-07 LAB — HCG, QUANTITATIVE, PREGNANCY: hCG, Beta Chain, Quant, S: 123900 m[IU]/mL — ABNORMAL HIGH (ref <5)

## 2015-11-07 LAB — ABO/RH: ABO/RH(D): B POS

## 2015-11-07 LAB — CBG MONITORING, ED: Glucose-Capillary: 108 mg/dL — ABNORMAL HIGH (ref 65–99)

## 2015-11-07 MED ORDER — CEPHALEXIN 250 MG/5ML PO SUSR: 500.0000 mg | Freq: Four times a day (QID) | ORAL | Status: AC

## 2015-11-07 MED ORDER — CEPHALEXIN 250 MG/5ML PO SUSR
500.0000 mg | Freq: Once | ORAL | Status: AC
  Administered 2015-11-07: 500 mg via ORAL
  Filled 2015-11-07 (×2): qty 20

## 2015-11-07 MED ORDER — DOXYLAMINE-PYRIDOXINE 10-10 MG PO TBEC: 2.0000 | DELAYED_RELEASE_TABLET | Freq: Every day | ORAL | Status: DC

## 2015-11-07 MED ORDER — CEPHALEXIN 500 MG PO CAPS
500.0000 mg | ORAL_CAPSULE | Freq: Once | ORAL | Status: DC
  Filled 2015-11-07: qty 1

## 2015-11-07 MED ORDER — CEPHALEXIN 500 MG PO CAPS: 500.0000 mg | ORAL_CAPSULE | Freq: Four times a day (QID) | ORAL | Status: DC

## 2015-11-07 NOTE — ED Notes (Signed)
Per verbal PA, pt to be given food and drink. Pt tolerating well.

## 2015-11-07 NOTE — Discharge Instructions (Signed)
Morning Sickness Morning sickness is when you feel sick to your stomach (nauseous) during pregnancy. You may feel sick to your stomach and throw up (vomit). You may feel sick in the morning, but you can feel this way any time of day. Some women feel very sick to their stomach and cannot stop throwing up (hyperemesis gravidarum). HOME CARE  Only take medicines as told by your doctor.  Take multivitamins as told by your doctor. Taking multivitamins before getting pregnant can stop or lessen the harshness of morning sickness.  Eat dry toast or unsalted crackers before getting out of bed.  Eat 5 to 6 small meals a day.  Eat dry and bland foods like rice and baked potatoes.  Do not drink liquids with meals. Drink between meals.  Do not eat greasy, fatty, or spicy foods.  Have someone cook for you if the smell of food causes you to feel sick or throw up.  If you feel sick to your stomach after taking prenatal vitamins, take them at night or with a snack.  Eat protein when you need a snack (nuts, yogurt, cheese).  Eat unsweetened gelatins for dessert.  Wear a bracelet used for sea sickness (acupressure wristband).  Go to a doctor that puts thin needles into certain body points (acupuncture) to improve how you feel.  Do not smoke.  Use a humidifier to keep the air in your house free of odors.  Get lots of fresh air. GET HELP IF:  You need medicine to feel better.  You feel dizzy or lightheaded.  You are losing weight. GET HELP RIGHT AWAY IF:   You feel very sick to your stomach and cannot stop throwing up.  You pass out (faint). MAKE SURE YOU:  Understand these instructions.  Will watch your condition.  Will get help right away if you are not doing well or get worse.   This information is not intended to replace advice given to you by your health care provider. Make sure you discuss any questions you have with your health care provider.   Document Released: 05/21/2004  Document Revised: 05/04/2014 Document Reviewed: 09/28/2012 Elsevier Interactive Patient Education 2016 [employer]  Urinary Tract Infection Urinary tract infections (UTIs) can develop anywhere along your urinary tract. Your urinary tract is your body's drainage system for removing wastes and extra water. Your urinary tract includes two kidneys, two ureters, a bladder, and a urethra. Your kidneys are a pair of bean-shaped organs. Each kidney is about the size of your fist. They are located below your ribs, one on each side of your spine. CAUSES Infections are caused by microbes, which are microscopic organisms, including fungi, viruses, and bacteria. These organisms are so small that they can only be seen through a microscope. Bacteria are the microbes that most commonly cause UTIs. SYMPTOMS  Symptoms of UTIs may vary by age and gender of the patient and by the location of the infection. Symptoms in young women typically include a frequent and intense urge to urinate and a painful, burning feeling in the bladder or urethra during urination. Older women and men are more likely to be tired, shaky, and weak and have muscle aches and abdominal pain. A fever may mean the infection is in your kidneys. Other symptoms of a kidney infection include pain in your back or sides below the ribs, nausea, and vomiting. DIAGNOSIS To diagnose a UTI, your caregiver will ask you about your symptoms. Your caregiver will also ask you to provide a  urine sample. The urine sample will be tested for bacteria and white blood cells. White blood cells are made by your body to help fight infection. TREATMENT  Typically, UTIs can be treated with medication. Because most UTIs are caused by a bacterial infection, they usually can be treated with the use of antibiotics. The choice of antibiotic and length of treatment depend on your symptoms and the type of bacteria causing your infection. HOME CARE INSTRUCTIONS  If you were  prescribed antibiotics, take them exactly as your caregiver instructs you. Finish the medication even if you feel better after you have only taken some of the medication.  Drink enough water and fluids to keep your urine clear or pale yellow.  Avoid caffeine, tea, and carbonated beverages. They tend to irritate your bladder.  Empty your bladder often. Avoid holding urine for long periods of time.  Empty your bladder before and after sexual intercourse.  After a bowel movement, women should cleanse from front to back. Use each tissue only once. SEEK MEDICAL CARE IF:   You have back pain.  You develop a fever.  Your symptoms do not begin to resolve within 3 days. SEEK IMMEDIATE MEDICAL CARE IF:   You have severe back pain or lower abdominal pain.  You develop chills.  You have nausea or vomiting.  You have continued burning or discomfort with urination. MAKE SURE YOU:   Understand these instructions.  Will watch your condition.  Will get help right away if you are not doing well or get worse.   This information is not intended to replace advice given to you by your health care provider. Make sure you discuss any questions you have with your health care provider.   Document Released: 01/21/2005 Document Revised: 01/02/2015 Document Reviewed: 05/22/2011 Elsevier Interactive Patient Education 2016 [employer].   Take the medicines as prescribed and make sure you are eating at least small meals/ drinks to avoid a low blood glucose level.  Take 2 tablets of the diclegis before bedtime which is safe to take in pregnancy and should help with your nausea.  If this does not help after the next 3 nights you may add a morning dose of one tablet also.  Keep your appointment with your doctor next Tuesday as planned.  Return here or call your doctor if you develop fevers, uncontrolled vomiting or worsening back pain.  These may be symptoms of a worsening urinary infection.

## 2015-11-07 NOTE — ED Provider Notes (Addendum)
CSN: [id number]     Arrival date & time 11/07/15  1022 History   First MD Initiated Contact with Patient 11/07/15 1026     Chief Complaint  Patient presents with  . Abdominal Pain     (Consider location/radiation/quality/duration/timing/severity/associated sxs/prior Treatment) The history is provided by the patient.   Kimberly Moore is a 25 y.o. female G2P1  Who is currently [redacted] weeks pregnant presenting with complaint of low suprapubic pressure, bilateral low back pain along with frequent urination associated with severe pain and pink tinged urine with occasional blood clots with urine passage.  She denies seeing blood or spotting between urination episodes.  She also endorses chronic nausea with this pregnancy and decreased PO intake.  She has had no oral intake today.  Denies fevers, chills, vomiting or diarrhea, denies vaginal discharge.  She was seen 4 weeks ago at [facility] for spotting and was diagnosed with a threatened miscarriage.  She bled for 3 days then resolved.  She had an [place] there which was normal for a intrauterine pregnancy.  She is followed at [facility] for her prenatal care.  She is taking a prenatal vitamin, is scheduled to see the Health dept in 5 days for      Past Medical History  Diagnosis Date  . Chronic headache    Past Surgical History  Procedure Laterality Date  . Foot surgery Left    History reviewed. No pertinent family history. Social History  Substance Use Topics  . Smoking status: Never Smoker   . Smokeless tobacco: None  . Alcohol Use: No   OB History    Gravida Para Term Preterm AB TAB SAB Ectopic Multiple Living   1              Review of Systems  Constitutional: Negative for fever.  HENT: Negative for congestion and sore throat.   Eyes: Negative.   Respiratory: Negative for chest tightness and shortness of breath.   Cardiovascular: Negative for chest pain.  Gastrointestinal: Positive for nausea. Negative for  vomiting.  Genitourinary: Positive for dysuria, frequency and hematuria. Negative for vaginal discharge.  Musculoskeletal: Positive for back pain. Negative for arthralgias, joint swelling and neck pain.  Skin: Negative.  Negative for rash and wound.  Neurological: Negative for dizziness, weakness, light-headedness, numbness and headaches.  Psychiatric/Behavioral: Negative.       Allergies  Review of patient's allergies indicates no known allergies.  Home Medications   Prior to Admission medications   Medication Sig Start Date End Date Taking? Authorizing Provider  butalbital-acetaminophen-caffeine (FIORICET) 50-325-40 MG tablet Take 1-2 tablets by mouth every 6 (six) hours as needed for headache. Patient not taking: Reported on 11/07/2015 04/08/15 04/07/16  Burgess Amor, PA-C  cephALEXin (KEFLEX) 500 MG capsule Take 1 capsule (500 mg total) by mouth 4 (four) times daily. 11/07/15   Burgess Amor, PA-C  Doxylamine-Pyridoxine (DICLEGIS) 10-10 MG TBEC Take 2 tablets by mouth at bedtime. 11/07/15   Burgess Amor, PA-C  HYDROcodone-acetaminophen (NORCO/VICODIN) 5-325 MG tablet Take 1 tablet by mouth every 4 (four) hours as needed. Patient not taking: Reported on 11/07/2015 04/12/15   Ivery Quale, PA-C  ibuprofen (ADVIL,MOTRIN) 600 MG tablet Take 1 tablet (600 mg total) by mouth every 6 (six) hours as needed. Patient not taking: Reported on 11/07/2015 04/12/15   Ivery Quale, PA-C  nystatin-triamcinolone [facility] II) cream Apply to affected area daily Patient not taking: Reported on 04/11/2015 01/11/14   Janne Napoleon, NP  promethazine (PHENERGAN) 25 MG  tablet Take 1 tablet (25 mg total) by mouth every 6 (six) hours as needed for nausea or vomiting (or headache). Patient not taking: Reported on 04/11/2015 12/23/13   Samuel Jester, DO   BP 114/70 mmHg  Pulse 84  Temp(Src) 98.6 F (37 C) (Oral)  Resp 18  Ht 5\' 8"  (1.727 m)  Wt 79.379 kg  BMI 26.61 kg/m2  SpO2 98%  LMP 04/01/2015 Physical Exam   Constitutional: She appears well-developed and well-nourished.  HENT:  Head: Normocephalic and atraumatic.  Eyes: Conjunctivae are normal.  Neck: Normal range of motion.  Cardiovascular: Normal rate, regular rhythm, normal heart sounds and intact distal pulses.   Pulmonary/Chest: Effort normal and breath sounds normal. She has no wheezes.  Abdominal: Soft. Bowel sounds are normal. She exhibits no distension. There is no tenderness. There is no rebound and no guarding.  Musculoskeletal: Normal range of motion.       Lumbar back: She exhibits normal range of motion, no tenderness and no bony tenderness.  Neurological: She is alert.  Skin: Skin is warm and dry.  Psychiatric: She has a normal mood and affect.  Nursing note and vitals reviewed.   ED Course  Procedures (including critical care time) Labs Review Labs Reviewed  BASIC METABOLIC PANEL - Abnormal; Notable for the following:    Calcium 8.6 (*)    All other components within normal limits  CBC WITH DIFFERENTIAL/PLATELET - Abnormal; Notable for the following:    Hemoglobin 11.6 (*)    HCT 34.5 (*)    All other components within normal limits  URINALYSIS, ROUTINE W REFLEX MICROSCOPIC (NOT AT [facility]) - Abnormal; Notable for the following:    Hgb urine dipstick LARGE (*)    Ketones, ur 40 (*)    Protein, ur 30 (*)    Nitrite POSITIVE (*)    Leukocytes, UA LARGE (*)    All other components within normal limits  HCG, QUANTITATIVE, PREGNANCY - Abnormal; Notable for the following:    hCG, Beta Chain, Quant, S 123900 (*)    All other components within normal limits  URINE MICROSCOPIC-ADD ON - Abnormal; Notable for the following:    Squamous Epithelial / LPF 0-5 (*)    Bacteria, UA MANY (*)    All other components within normal limits  CBG MONITORING, ED - Abnormal; Notable for the following:    Glucose-Capillary 108 (*)    All other components within normal limits  ABO/RH    Imaging Review No results found. I have  personally reviewed and evaluated these images and lab results as part of my medical decision-making.   EKG Interpretation None      MDM   Final diagnoses:  UTI (lower urinary tract infection)  Fasting hypoglycemia  Morning sickness    Patients labs reviewed. Urine cx ordered.  Results were also discussed with patient. She ate a meal while here, cbg's remained stable.  Suspect simple uti, no exam findings or hx to suggest pyelonephritis.  Beta quant c/w dates.  [place] results from [facility] completed 10/08/15 reviewed - confirming intrauterine pregnancy EGA [redacted] weeks 0 days on that date. Keflex prescribed - liquid as pt unable to tolerate tabs.  Prescribed diclegis for nausea (may need to crush) qhs. Advised frequent small snacks, drinks to avoid hypoglycemia.  Discussed signs of worsening infection - fever, vomiting, flank pain - pt to return here if sx worsen.  The patient appears reasonably screened and/or stabilized for discharge and I doubt any other medical condition  or other [facility] requiring further screening, evaluation, or treatment in the ED at this time prior to discharge.      Burgess AmorJulie Arline Ketter, PA-C 11/07/15 1302  Jacalyn LefevreJulie Haviland, MD 11/07/15 1306  Burgess AmorJulie Doyal Saric, PA-C 11/29/15 1151  Jacalyn LefevreJulie Haviland, MD 12/02/15 2003

## 2015-11-07 NOTE — ED Notes (Signed)
Pt. POC CBG = 108

## 2015-11-07 NOTE — ED Notes (Signed)
[facility]

## 2015-11-07 NOTE — ED Notes (Signed)
Lower abdominal pain since yesterday.  [redacted] weeks pregnant.  Spotting.  Seen with same at [place] 3 weeks ago.  CBG 69.  EMS gave 15 grams glucose and CBG 68 now.

## 2015-11-09 LAB — URINE CULTURE

## 2015-11-10 ENCOUNTER — Telehealth (HOSPITAL_BASED_OUTPATIENT_CLINIC_OR_DEPARTMENT_OTHER): Payer: Self-pay

## 2015-11-10 NOTE — Telephone Encounter (Signed)
Post ED Visit - Positive Culture Follow-up  Culture report reviewed by antimicrobial stewardship pharmacist:  []  Kimberly Moore, Pharm.D. []  Kimberly Moore, Pharm.D., BCPS []  Kimberly Moore, Pharm.D. []  Kimberly Moore, Pharm.D., BCPS []  Kimberly Moore, [place] BCPS, AAHIVP []  Kimberly Moore, Pharm.D., BCPS, AAHIVP []  Kimberly Moore, Pharm.D. []  Kimberly Moore, [address] Tressia Minersachel Ramsarg Pharm D Positive urine culture Treated with Cephalexin, organism sensitive to the same and no further patient follow-up is required at this time.  Kimberly Moore, Kimberly Moore 11/10/2015, 11:28 AM

## 2015-11-12 DIAGNOSIS — O361919 Maternal care for other isoimmunization, first trimester, other fetus: Secondary | ICD-10-CM | POA: Diagnosis not present

## 2015-11-12 DIAGNOSIS — Z3480 Encounter for supervision of other normal pregnancy, unspecified trimester: Secondary | ICD-10-CM | POA: Diagnosis not present

## 2015-11-18 LAB — OB RESULTS CONSOLE HEPATITIS B SURFACE ANTIGEN: Hepatitis B Surface Ag: NEGATIVE

## 2015-11-18 LAB — OB RESULTS CONSOLE ANTIBODY SCREEN: Antibody Screen: NEGATIVE

## 2015-11-18 LAB — OB RESULTS CONSOLE ABO/RH: RH Type: POSITIVE

## 2015-11-18 LAB — OB RESULTS CONSOLE RUBELLA ANTIBODY, IGM: RUBELLA: IMMUNE

## 2015-11-18 LAB — OB RESULTS CONSOLE VARICELLA ZOSTER ANTIBODY, IGG: VARICELLA IGG: IMMUNE

## 2015-11-22 LAB — OB RESULTS CONSOLE HIV ANTIBODY (ROUTINE TESTING): HIV: NONREACTIVE

## 2015-11-25 LAB — OB RESULTS CONSOLE RPR: RPR: NONREACTIVE

## 2016-01-01 DIAGNOSIS — R875 Abnormal microbiological findings in specimens from female genital organs: Secondary | ICD-10-CM | POA: Diagnosis not present

## 2016-01-01 DIAGNOSIS — Z3009 Encounter for other general counseling and advice on contraception: Secondary | ICD-10-CM | POA: Diagnosis not present

## 2016-01-01 DIAGNOSIS — Z0000 Encounter for general adult medical examination without abnormal findings: Secondary | ICD-10-CM | POA: Diagnosis not present

## 2016-01-01 DIAGNOSIS — R87610 Atypical squamous cells of undetermined significance on cytologic smear of cervix (ASC-US): Secondary | ICD-10-CM | POA: Diagnosis not present

## 2016-01-01 DIAGNOSIS — Z3482 Encounter for supervision of other normal pregnancy, second trimester: Secondary | ICD-10-CM | POA: Diagnosis not present

## 2016-01-01 LAB — OB RESULTS CONSOLE GC/CHLAMYDIA
Chlamydia: NEGATIVE
Gonorrhea: NEGATIVE

## 2016-01-22 DIAGNOSIS — Z36 Encounter for antenatal screening of mother: Secondary | ICD-10-CM | POA: Diagnosis not present

## 2016-01-22 DIAGNOSIS — Z3A22 22 weeks gestation of pregnancy: Secondary | ICD-10-CM | POA: Diagnosis not present

## 2016-02-17 DIAGNOSIS — N760 Acute vaginitis: Secondary | ICD-10-CM | POA: Diagnosis not present

## 2016-02-17 DIAGNOSIS — O23592 Infection of other part of genital tract in pregnancy, second trimester: Secondary | ICD-10-CM | POA: Diagnosis not present

## 2016-02-17 DIAGNOSIS — Z3A27 27 weeks gestation of pregnancy: Secondary | ICD-10-CM | POA: Diagnosis not present

## 2016-02-17 DIAGNOSIS — Z3A29 29 weeks gestation of pregnancy: Secondary | ICD-10-CM | POA: Diagnosis not present

## 2016-02-17 DIAGNOSIS — M545 Low back pain: Secondary | ICD-10-CM | POA: Diagnosis not present

## 2016-02-17 DIAGNOSIS — B9689 Other specified bacterial agents as the cause of diseases classified elsewhere: Secondary | ICD-10-CM | POA: Diagnosis not present

## 2016-02-19 DIAGNOSIS — Z364 Encounter for antenatal screening for fetal growth retardation: Secondary | ICD-10-CM | POA: Diagnosis not present

## 2016-03-03 DIAGNOSIS — Z3483 Encounter for supervision of other normal pregnancy, third trimester: Secondary | ICD-10-CM | POA: Diagnosis not present

## 2016-03-03 LAB — OB RESULTS CONSOLE HIV ANTIBODY (ROUTINE TESTING): HIV: NONREACTIVE

## 2016-03-03 LAB — OB RESULTS CONSOLE RPR: RPR: NONREACTIVE

## 2016-03-18 DIAGNOSIS — Z23 Encounter for immunization: Secondary | ICD-10-CM | POA: Diagnosis not present

## 2016-03-18 DIAGNOSIS — Z3483 Encounter for supervision of other normal pregnancy, third trimester: Secondary | ICD-10-CM | POA: Diagnosis not present

## 2016-03-18 DIAGNOSIS — N898 Other specified noninflammatory disorders of vagina: Secondary | ICD-10-CM | POA: Diagnosis not present

## 2016-04-28 ENCOUNTER — Encounter: Payer: Self-pay | Admitting: *Deleted

## 2016-04-28 ENCOUNTER — Observation Stay
Admission: EM | Admit: 2016-04-28 | Discharge: 2016-04-28 | Disposition: A | Discharge status: Home / Self Care | Payer: 59 | Attending: Obstetrics & Gynecology | Admitting: Obstetrics & Gynecology

## 2016-04-28 DIAGNOSIS — O9989 Other specified diseases and conditions complicating pregnancy, childbirth and the puerperium: Secondary | ICD-10-CM | POA: Diagnosis not present

## 2016-04-28 DIAGNOSIS — Z79899 Other long term (current) drug therapy: Secondary | ICD-10-CM | POA: Diagnosis not present

## 2016-04-28 DIAGNOSIS — R102 Pelvic and perineal pain: Secondary | ICD-10-CM | POA: Diagnosis not present

## 2016-04-28 DIAGNOSIS — Z3A37 37 weeks gestation of pregnancy: Secondary | ICD-10-CM | POA: Diagnosis not present

## 2016-04-28 DIAGNOSIS — Z3483 Encounter for supervision of other normal pregnancy, third trimester: Principal | ICD-10-CM | POA: Insufficient documentation

## 2016-04-28 DIAGNOSIS — R109 Unspecified abdominal pain: Secondary | ICD-10-CM | POA: Diagnosis not present

## 2016-04-28 HISTORY — DX: Other specified health status: Z78.9

## 2016-04-28 NOTE — Discharge Summary (Signed)
Physician Final Progress Note  Patient ID: Kimberly RhymeChelsea Moore MRN: [id number] DOB/AGE: [birth year] 26 y.o.  Admit date: 04/28/2016 Admitting provider: [name], CNM Discharge date: 04/28/2016   Admission Diagnoses: G2P1 at 4759w3d with c/o right side abdominal pain. Pt states pain began around 8 am this morning and was occurring every 2-4 minutes and lasting for 10-20 seconds. Since she has been here the pain is spacing out to every 10-15 minutes. She states she has had some dehydration during this pregnancy. She reports positive fetal movement and denies leakage of fluid, vaginal bleeding, s/s of UTI.  Discharge Diagnoses:  Active Problems:   Indication for care in labor and delivery, antepartum IUP with reactive NST not in labor   Past Medical History:  Diagnosis Date  . Medical history non-contributory     Past Surgical History:  Procedure Laterality Date  . FOOT SURGERY Left     No current facility-administered medications on file prior to encounter.    Current Outpatient Prescriptions on File Prior to Encounter  Medication Sig Dispense Refill  . cephALEXin (KEFLEX) 500 MG capsule Take 1 capsule (500 mg total) by mouth 4 (four) times daily. (Patient not taking: Reported on 04/28/2016) 28 capsule 0  . Doxylamine-Pyridoxine (DICLEGIS) 10-10 MG TBEC Take 2 tablets by mouth at bedtime. (Patient not taking: Reported on 04/28/2016) 60 tablet 0  . HYDROcodone-acetaminophen (NORCO/VICODIN) 5-325 MG tablet Take 1 tablet by mouth every 4 (four) hours as needed. (Patient not taking: Reported on 04/28/2016) 15 tablet 0  . ibuprofen (ADVIL,MOTRIN) 600 MG tablet Take 1 tablet (600 mg total) by mouth every 6 (six) hours as needed. (Patient not taking: Reported on 04/28/2016) 20 tablet 0  . nystatin-triamcinolone (MYCOLOG II) cream Apply to affected area daily (Patient not taking: Reported on 04/28/2016) 15 g 0  . promethazine (PHENERGAN) 25 MG tablet Take 1 tablet (25 mg total) by mouth every 6 (six)  hours as needed for nausea or vomiting (or headache). (Patient not taking: Reported on 04/11/2015) 8 tablet 0    No Known Allergies  Social History   Social History  . Marital status: Single    Spouse name: N/A  . Number of children: N/A  . Years of education: N/A   Occupational History  . Not on file.   Social History Main Topics  . Smoking status: Never Smoker  . Smokeless tobacco: Never Used  . Alcohol use No  . Drug use: No  . Sexual activity: Yes    Birth control/ protection: None   Other Topics Concern  . Not on file   Social History Narrative  . No narrative on file    Physical Exam: BP 105/61 (BP Location: Right Arm)   Pulse (!) 104   Temp 98.4 F (36.9 C) (Oral)   Resp 18   Ht 5\' 7"  (1.702 m)   Wt 201 lb (91.2 kg)   BMI 31.48 kg/m   Gen: NAD CV: RRR Pulm: CTAB Pelvic: FT/40/-2 posterior Ext: no evidence of DVT Toco: occasional mild contraction Fetal Well Being: 140 bpm baseline, moderate variability, +accelerations, -decelerations  Consults: None  Significant Findings/ Diagnostic Studies: none  Procedures: NST  Discharge Condition: good  Disposition: 01-Home or Self Care  Diet: Regular diet, increase hydration  Discharge Activity: Activity as tolerated  Tylenol and heat- rice sock, bath, shower- for pain  Discharge Instructions    Discharge activity:  No Restrictions    Complete by:  As directed    Discharge diet:  No restrictions  Complete by:  As directed    Fetal Kick Count:  Lie on our left side for one hour after a meal, and count the number of times your baby kicks.  If it is less than 5 times, get up, move around and drink some juice.  Repeat the test 30 minutes later.  If it is still less than 5 kicks in an hour, notify your doctor.    Complete by:  As directed    LABOR:  When conractions begin, you should start to time them from the beginning of one contraction to the beginning  of the next.  When contractions are 5 - 10  minutes apart or less and have been regular for at least an hour, you should call your health care provider.    Complete by:  As directed    No sexual activity restrictions    Complete by:  As directed    Notify physician for bleeding from the vagina    Complete by:  As directed    Notify physician for blurring of vision or spots before the eyes    Complete by:  As directed    Notify physician for chills or fever    Complete by:  As directed    Notify physician for fainting spells, "black outs" or loss of consciousness    Complete by:  As directed    Notify physician for increase in vaginal discharge    Complete by:  As directed    Notify physician for leaking of fluid    Complete by:  As directed    Notify physician for pain or burning when urinating    Complete by:  As directed    Notify physician for pelvic pressure (sudden increase)    Complete by:  As directed    Notify physician for severe or continued nausea or vomiting    Complete by:  As directed    Notify physician for sudden gushing of fluid from the vagina (with or without continued leaking)    Complete by:  As directed    Notify physician for sudden, constant, or occasional abdominal pain    Complete by:  As directed    Notify physician if baby moving less than usual    Complete by:  As directed      Allergies as of 04/28/2016   No Known Allergies     Medication List    STOP taking these medications   cephALEXin 500 MG capsule Commonly known as:  KEFLEX   Doxylamine-Pyridoxine 10-10 MG Tbec Commonly known as:  DICLEGIS   HYDROcodone-acetaminophen 5-325 MG tablet Commonly known as:  NORCO/VICODIN   ibuprofen 600 MG tablet Commonly known as:  ADVIL,MOTRIN   nystatin-triamcinolone cream Commonly known as:  MYCOLOG II   promethazine 25 MG tablet Commonly known as:  PHENERGAN     TAKE these medications   acetaminophen 325 MG tablet Commonly known as:  TYLENOL Take 325 mg by mouth every 4 (four) hours  as needed for mild pain.   multivitamin-prenatal 27-0.8 MG Tabs tablet Take 1 tablet by mouth daily at 12 noon.      Follow-up Information    [facility] Follow up.   Why:  go to regular scheduled prenatal appointment Contact information: [address] [place] [address] [phone]           Total time spent taking care of this patient: 15 minutes  Signed: Obie Dredge  04/28/2016, 12:26 PM

## 2016-04-28 NOTE — OB Triage Note (Signed)
CO pain, right sided, from under right breast to right groin. Not tender to the touch. Describes as sharp, stabbing pain on occasion lasting approx 10-20 seconds each time. Moans constantly as if in pain. Reports feeling pain every 2-3 minutes. Denies any LOF. Elaina HoopsElks, Kimberly Moore S

## 2016-04-30 DIAGNOSIS — Z22330 Carrier of Group B streptococcus: Secondary | ICD-10-CM | POA: Diagnosis not present

## 2016-05-29 DIAGNOSIS — F418 Other specified anxiety disorders: Secondary | ICD-10-CM | POA: Diagnosis not present

## 2016-05-29 DIAGNOSIS — O480 Post-term pregnancy: Secondary | ICD-10-CM | POA: Diagnosis not present

## 2016-05-29 DIAGNOSIS — Z3A41 41 weeks gestation of pregnancy: Secondary | ICD-10-CM | POA: Diagnosis not present

## 2016-05-29 DIAGNOSIS — O3443 Maternal care for other abnormalities of cervix, third trimester: Secondary | ICD-10-CM | POA: Diagnosis not present

## 2016-05-29 DIAGNOSIS — Z3A40 40 weeks gestation of pregnancy: Secondary | ICD-10-CM | POA: Diagnosis not present

## 2016-05-29 DIAGNOSIS — O99343 Other mental disorders complicating pregnancy, third trimester: Secondary | ICD-10-CM | POA: Diagnosis not present

## 2016-05-29 DIAGNOSIS — O99824 Streptococcus B carrier state complicating childbirth: Secondary | ICD-10-CM | POA: Diagnosis not present

## 2016-07-15 DIAGNOSIS — B373 Candidiasis of vulva and vagina: Secondary | ICD-10-CM | POA: Diagnosis not present

## 2016-07-15 DIAGNOSIS — Z30011 Encounter for initial prescription of contraceptive pills: Secondary | ICD-10-CM | POA: Diagnosis not present

## 2016-07-15 DIAGNOSIS — Z3009 Encounter for other general counseling and advice on contraception: Secondary | ICD-10-CM | POA: Diagnosis not present

## 2016-07-15 DIAGNOSIS — Z87898 Personal history of other specified conditions: Secondary | ICD-10-CM | POA: Diagnosis not present

## 2016-07-15 DIAGNOSIS — Z8742 Personal history of other diseases of the female genital tract: Secondary | ICD-10-CM | POA: Diagnosis not present

## 2016-07-16 DIAGNOSIS — Z0000 Encounter for general adult medical examination without abnormal findings: Secondary | ICD-10-CM | POA: Diagnosis not present

## 2016-07-16 DIAGNOSIS — Z3009 Encounter for other general counseling and advice on contraception: Secondary | ICD-10-CM | POA: Diagnosis not present

## 2016-07-23 DIAGNOSIS — Z202 Contact with and (suspected) exposure to infections with a predominantly sexual mode of transmission: Secondary | ICD-10-CM | POA: Diagnosis not present

## 2016-09-06 ENCOUNTER — Encounter (HOSPITAL_COMMUNITY): Payer: Self-pay | Admitting: *Deleted

## 2016-09-06 DIAGNOSIS — N390 Urinary tract infection, site not specified: Secondary | ICD-10-CM | POA: Diagnosis not present

## 2016-09-06 DIAGNOSIS — B001 Herpesviral vesicular dermatitis: Secondary | ICD-10-CM | POA: Diagnosis not present

## 2016-09-06 DIAGNOSIS — R300 Dysuria: Secondary | ICD-10-CM | POA: Diagnosis present

## 2016-09-06 NOTE — ED Triage Notes (Signed)
Pt reports burning w/ urination for the past 2 to 3 days. Also c/o a rash to her genitals.

## 2016-09-07 ENCOUNTER — Emergency Department (HOSPITAL_COMMUNITY)
Admission: EM | Admit: 2016-09-07 | Discharge: 2016-09-07 | Disposition: A | Discharge status: Home / Self Care | Payer: 59 | Attending: Emergency Medicine | Admitting: Emergency Medicine

## 2016-09-07 DIAGNOSIS — B001 Herpesviral vesicular dermatitis: Secondary | ICD-10-CM | POA: Diagnosis not present

## 2016-09-07 DIAGNOSIS — N390 Urinary tract infection, site not specified: Secondary | ICD-10-CM | POA: Diagnosis not present

## 2016-09-07 LAB — URINALYSIS, ROUTINE W REFLEX MICROSCOPIC
BILIRUBIN URINE: NEGATIVE
Glucose, UA: NEGATIVE mg/dL
HGB URINE DIPSTICK: NEGATIVE
Ketones, ur: 5 mg/dL — AB
NITRITE: NEGATIVE
PH: 6 (ref 5.0–8.0)
Protein, ur: NEGATIVE mg/dL
SPECIFIC GRAVITY, URINE: 1.03 (ref 1.005–1.030)

## 2016-09-07 LAB — WET PREP, GENITAL
Clue Cells Wet Prep HPF POC: NONE SEEN
Sperm: NONE SEEN
TRICH WET PREP: NONE SEEN
Yeast Wet Prep HPF POC: NONE SEEN

## 2016-09-07 LAB — PREGNANCY, URINE: PREG TEST UR: NEGATIVE

## 2016-09-07 MED ORDER — VALACYCLOVIR HCL 1 G PO TABS: 1000.0000 mg | ORAL_TABLET | Freq: Two times a day (BID) | ORAL | 0 refills | Status: AC

## 2016-09-07 MED ORDER — LIDOCAINE HCL (PF) 1 % IJ SOLN
INTRAMUSCULAR | Status: AC
  Filled 2016-09-07: qty 5

## 2016-09-07 MED ORDER — AZITHROMYCIN 250 MG PO TABS
1000.0000 mg | ORAL_TABLET | Freq: Once | ORAL | Status: AC
  Administered 2016-09-07: 1000 mg via ORAL
  Filled 2016-09-07: qty 4

## 2016-09-07 MED ORDER — CEFTRIAXONE SODIUM 1 G IJ SOLR
1.0000 g | Freq: Once | INTRAMUSCULAR | Status: AC
  Administered 2016-09-07: 1 g via INTRAMUSCULAR
  Filled 2016-09-07: qty 10

## 2016-09-07 MED ORDER — CEPHALEXIN 500 MG PO CAPS: 500.0000 mg | ORAL_CAPSULE | Freq: Three times a day (TID) | ORAL | 0 refills | Status: AC

## 2016-09-07 MED ORDER — LIDOCAINE HCL 2 % EX GEL
1.0000 "application " | Freq: Once | CUTANEOUS | Status: AC
  Administered 2016-09-07: 1 via TOPICAL
  Filled 2016-09-07: qty 10

## 2016-09-07 NOTE — ED Notes (Signed)
Pt alert & oriented x4, stable gait. Patient given discharge instructions, paperwork & prescription(s). Patient  instructed to stop at the registration desk to finish any additional paperwork. Patient verbalized understanding. Pt left department w/ no further questions. 

## 2016-09-07 NOTE — Discharge Instructions (Signed)
You were seen today for painful urination and new lesions. Lesions are concerning for genital herpes. For your initial outbreak he will be started on Valtrex twice daily for 10 days. Follow-up with health department or her primary physician for recheck and prescription for reduction of transmission. You will also be given medications for urinary tract infection. Continue to use a barrier cream and lidocaine jelly as needed. Always have protected sex. Abstain from sexual activities until lesions are cleared and at least 10 days.

## 2016-09-07 NOTE — ED Provider Notes (Signed)
[facility]-EMER[facility]ENCY DEPT Provider Note   CSN: [id number] Arrival date & time: 09/06/16  2311     History   Chief Complaint Chief Complaint  Patient presents with  . Urinary Tract Infection    HPI Kimberly Moore is a 26 y.o. female.  HPI  This is a 26 year old female who presents with dysuria and "painful lesions" in her vaginal area. Patient reports one week history of dysuria. She states that she has taking cranberry juice and Azo with no relief. She denies any fever or flank pain. She does report foul-smelling odor to her urine. She states that over the last couple days she has noticed increased pain with urination and she noted several small painful lesions around her vagina. No new sexual partners. States that she uses protection. She has had an STD in the past. She states that she has been using Vaseline with minimal relief. She states "it feels like I'm on fire."  Past Medical History:  Diagnosis Date  . Medical history non-contributory     Patient Active Problem List   Diagnosis Date Noted  . Indication for care in labor and delivery, antepartum 04/28/2016    Past Surgical History:  Procedure Laterality Date  . FOOT SURGERY Left     OB History    Gravida Para Term Preterm AB Living   2 1 1  0 0 1   SAB TAB Ectopic Multiple Live Births                   Home Medications    Prior to Admission medications   Medication Sig Start Date End Date Taking? Authorizing Provider  acetaminophen (TYLENOL) 325 MG tablet Take 325 mg by mouth every 4 (four) hours as needed for mild pain.    [provider]  cephALEXin (KEFLEX) 500 MG capsule Take 1 capsule (500 mg total) by mouth 3 (three) times daily. 09/07/16   Horton, Mayer Maskerourtney F, MD  Prenatal Vit-Fe Fumarate-FA (MULTIVITAMIN-PRENATAL) 27-0.8 MG TABS tablet Take 1 tablet by mouth daily at 12 noon.    [provider]  valACYclovir (VALTREX) 1000 MG tablet Take 1 tablet (1,000 mg total) by mouth 2 (two) times  daily. 09/07/16 09/21/16  Shon BatonHorton, Courtney F, MD    Family History No family history on file.  Social History Social History  Substance Use Topics  . Smoking status: Never Smoker  . Smokeless tobacco: Never Used  . Alcohol use No     Allergies   Patient has no known allergies.   Review of Systems Review of Systems  Constitutional: Negative for fever.  Gastrointestinal: Negative for nausea and vomiting.  Genitourinary: Positive for dysuria, vaginal discharge and vaginal pain. Negative for flank pain and pelvic pain.  All other systems reviewed and are negative.    Physical Exam Updated Vital Signs BP 134/71 (BP Location: Right Arm)   Pulse 87   Temp 98.6 F (37 C) (Oral)   Resp 18   Ht 5\' 8"  (1.727 m)   Wt 187 lb (84.8 kg)   SpO2 100%   Breastfeeding? Unknown   BMI 28.43 kg/m   Physical Exam  Constitutional: She is oriented to person, place, and time. She appears well-developed and well-nourished.  HENT:  Head: Normocephalic and atraumatic.  Cardiovascular: Normal rate and regular rhythm.   Pulmonary/Chest: Effort normal. No respiratory distress.  Abdominal: Soft. There is no tenderness.  Genitourinary:  Genitourinary Comments: External vaginal exam with multiple less than 0.5 cm ulcerations about the vaginal introitus and  adjacent to the urethra, there is cervical friability with moderate vaginal discharge, no cervical motion tenderness, no adnexal tenderness  Neurological: She is alert and oriented to person, place, and time.  Skin: Skin is warm and dry.  Psychiatric: She has a normal mood and affect.  Nursing note and vitals reviewed.    ED Treatments / Results  Labs (all labs ordered are listed, but only abnormal results are displayed) Labs Reviewed  WET PREP, GENITAL - Abnormal; Notable for the following:       Result Value   WBC, Wet Prep HPF POC MANY (*)    All other components within normal limits  URINALYSIS, ROUTINE W REFLEX MICROSCOPIC -  Abnormal; Notable for the following:    APPearance HAZY (*)    Ketones, ur 5 (*)    Leukocytes, UA MODERATE (*)    Bacteria, UA RARE (*)    Squamous Epithelial / LPF 6-30 (*)    All other components within normal limits  URINE CULTURE  PREGNANCY, URINE  GC/CHLAMYDIA PROBE AMP ([place]) NOT AT [facility]    EKG  EKG Interpretation None       Radiology No results found.  Procedures Procedures (including critical care time)  Medications Ordered in ED Medications  lidocaine (XYLOCAINE) 2 % jelly 1 application (not administered)  azithromycin (ZITHROMAX) tablet 1,000 mg (not administered)  cefTRIAXone (ROCEPHIN) injection 1 g (not administered)  lidocaine (PF) (XYLOCAINE) 1 % injection (not administered)     Initial Impression / Assessment and Plan / ED Course  I have reviewed the triage vital signs and the nursing notes.  Pertinent labs & imaging results that were available during my care of the patient were reviewed by me and considered in my medical decision making (see chart for details).     Patient presents with dysuria followed by noting lesions or vaginal introitus. She is nontoxic-appearing. Urine does appear dirty. We'll send culture and treat for urinary tract infection. She also has a lesion suspicious for herpes. She was tested for [facility], chlamydia. She was empirically treated with Rocephin and azithromycin. Patient was provided with lidocaine jelly. I discussed with her safe sex practices and treatment with Valtrex for herpes.  Patient stated understanding. She is otherwise nontoxic-appearing. Follow-up with health department.  After history, exam, and medical workup I feel the patient has been appropriately medically screened and is safe for discharge home. Pertinent diagnoses were discussed with the patient. Patient was given return precautions.   Final Clinical Impressions(s) / ED Diagnoses   Final diagnoses:  Lower urinary tract infectious disease  Herpes  labialis    New Prescriptions New Prescriptions   CEPHALEXIN (KEFLEX) 500 MG CAPSULE    Take 1 capsule (500 mg total) by mouth 3 (three) times daily.   VALACYCLOVIR (VALTREX) 1000 MG TABLET    Take 1 tablet (1,000 mg total) by mouth 2 (two) times daily.     Shon Baton, MD 09/07/16 [phone]

## 2016-09-08 LAB — GC/CHLAMYDIA PROBE AMP (~~LOC~~) NOT AT ARMC
CHLAMYDIA, DNA PROBE: NEGATIVE
NEISSERIA GONORRHEA: NEGATIVE

## 2016-09-09 LAB — URINE CULTURE: Culture: >=100000 — AB

## 2016-09-10 ENCOUNTER — Telehealth: Payer: Self-pay | Admitting: *Deleted

## 2016-09-10 NOTE — Telephone Encounter (Signed)
Post ED Visit - Positive Culture Follow-up  Culture report reviewed by antimicrobial stewardship pharmacist:  []  Enzo BiNathan Batchelder, Pharm.D. [x]  Celedonio MiyamotoJeremy Frens, [address]Pharm[address]D., BCPS AQ-ID []  Garvin FilaMike Maccia, Pharm.D., BCPS []  Georgina PillionElizabeth Martin, Pharm.D., BCPS []  ElmdaleMinh Pham, [place]Pharm.D., BCPS, AAHIVP []  Estella HuskMichelle Turner, Pharm.D., BCPS, AAHIVP []  Lysle Pearlachel Rumbarger, PharmD, BCPS []  Casilda Carlsaylor Stone, PharmD, BCPS []  Pollyann SamplesAndy Johnston, PharmD, BCPS  Positive urine culture Treated with Cephalexin, organism sensitive to the same and no further patient follow-up is required at this time.  Virl AxeRobertson, Anessa Charley [facility] 09/10/2016, 11:37 AM

## 2016-10-14 DIAGNOSIS — R509 Fever, unspecified: Secondary | ICD-10-CM | POA: Diagnosis not present

## 2016-10-14 DIAGNOSIS — H6691 Otitis media, unspecified, right ear: Secondary | ICD-10-CM | POA: Diagnosis not present

## 2016-10-14 DIAGNOSIS — J069 Acute upper respiratory infection, unspecified: Secondary | ICD-10-CM | POA: Diagnosis not present

## 2016-12-11 IMAGING — CT CT HEAD W/O CM
1 series · 16 of 30 positions shown, 20 images · non-contrast
Comparison: None.

CLINICAL DATA: Left-sided headache for 1 week.

EXAM:
CT HEAD WITHOUT CONTRAST
TECHNIQUE: Contiguous axial images were obtained from the base of the skull
through the vertex without intravenous contrast.

[Series 2: headtrauma 4.8 h37s · axial · 0.50mm/px · z∈[+108,+241]mm · 16 of 30 slices shown, 20 images]
[im 2/30  brain]
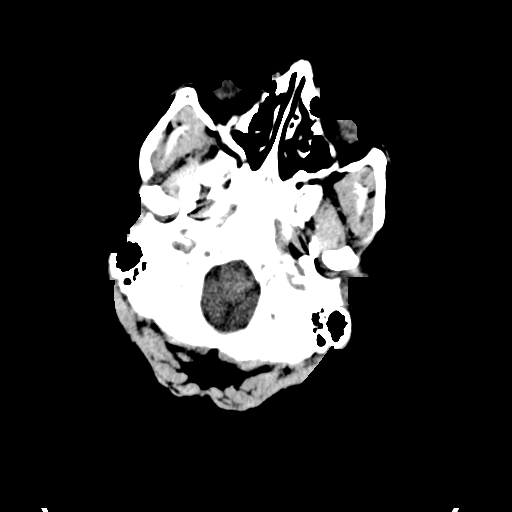
[im 2/30  bone]
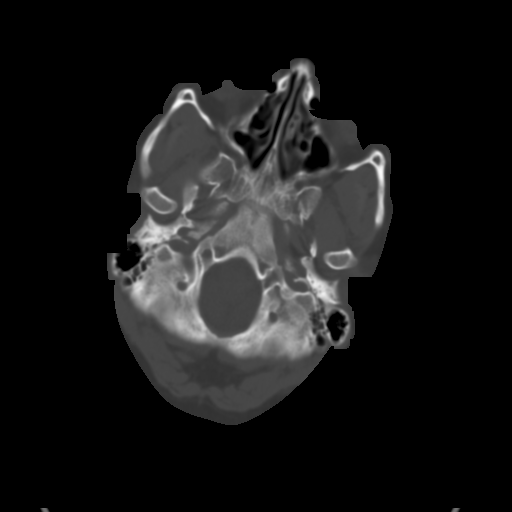
[im 4/30  brain]
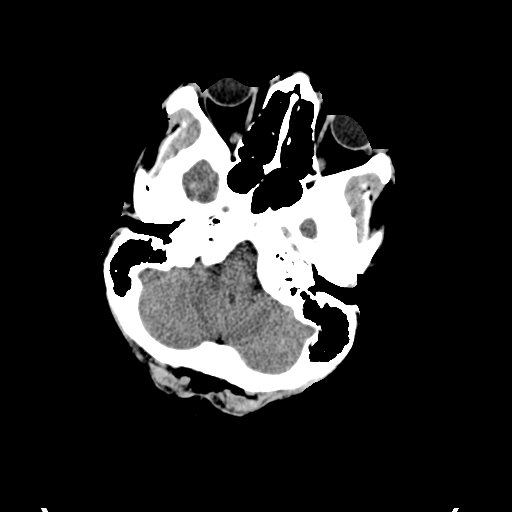
[im 6/30  brain]
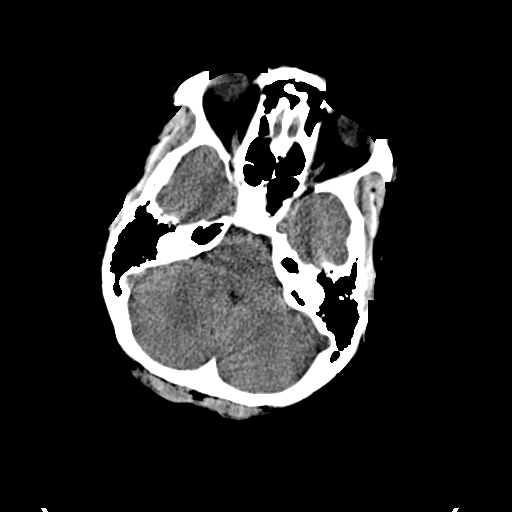
[im 8/30  brain]
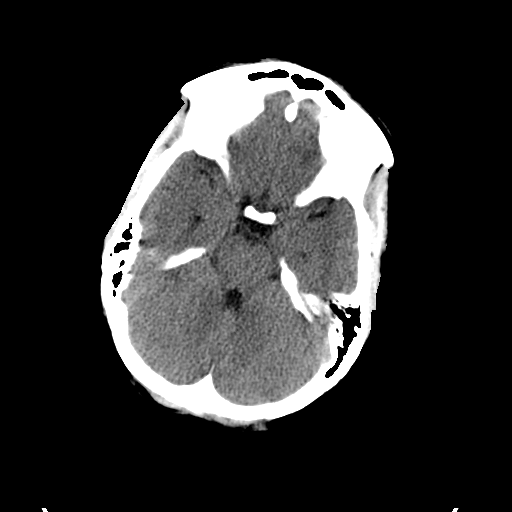
[im 9/30  brain]
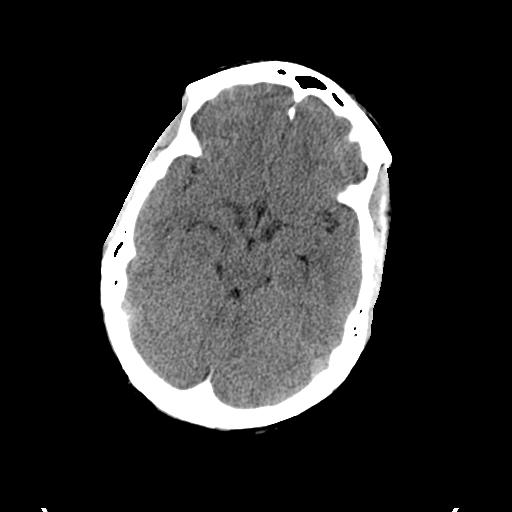
[im 9/30  bone]
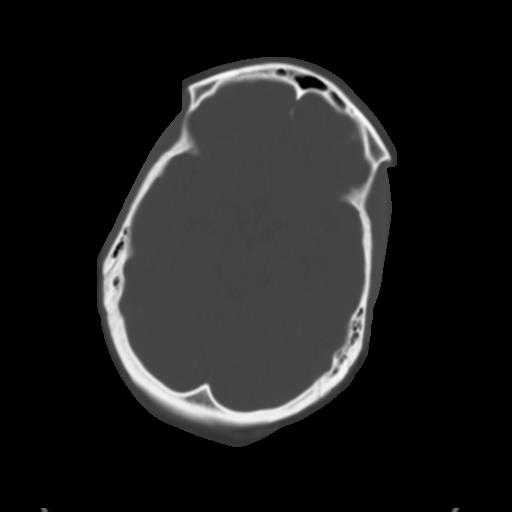
[im 11/30  brain]
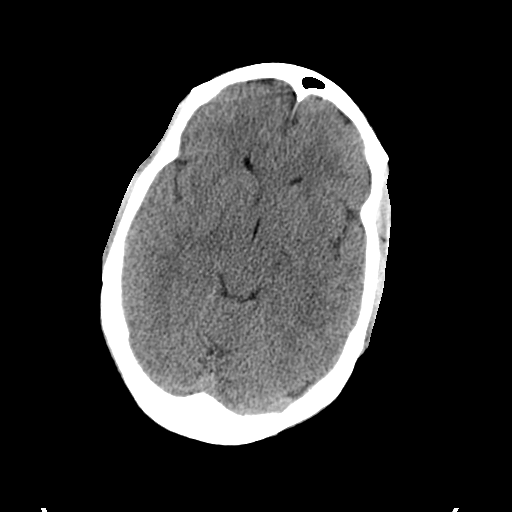
[im 13/30  brain]
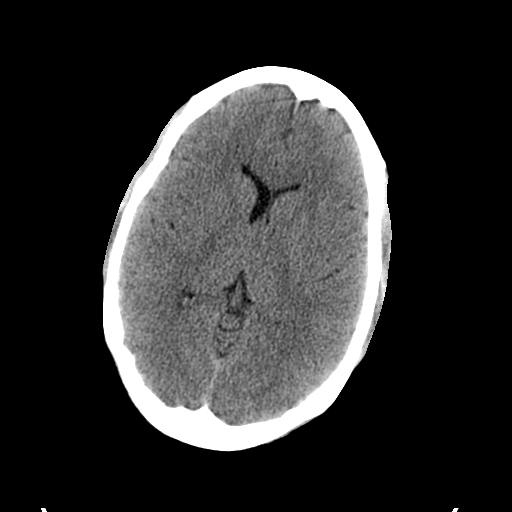
[im 15/30  brain]
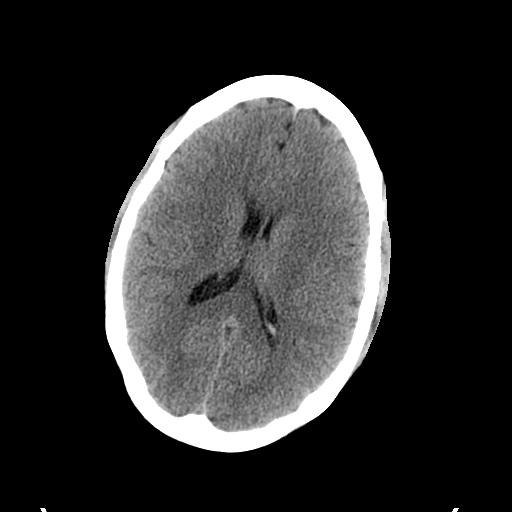
[im 16/30  brain]
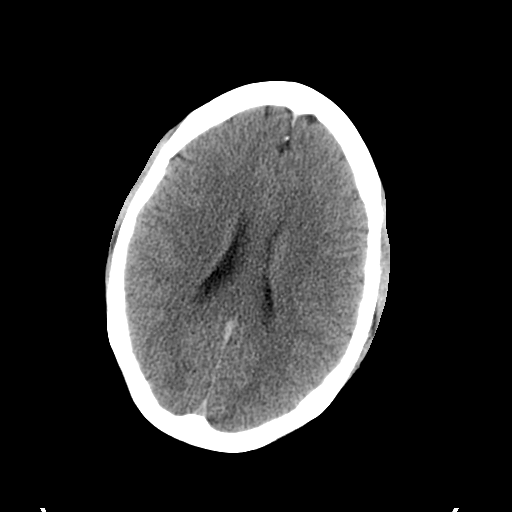
[im 16/30  bone]
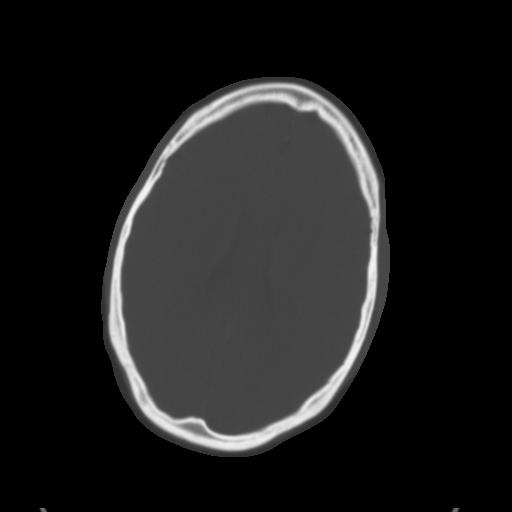
[im 18/30  brain]
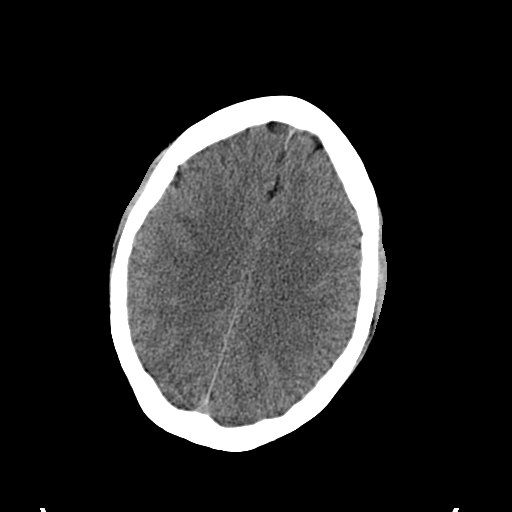
[im 20/30  brain]
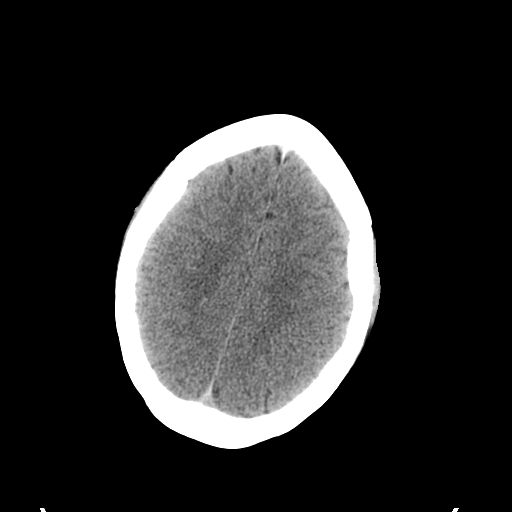
[im 22/30  brain]
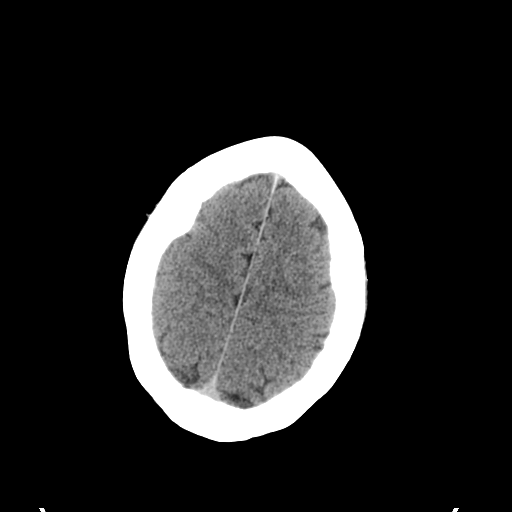
[im 23/30  brain]
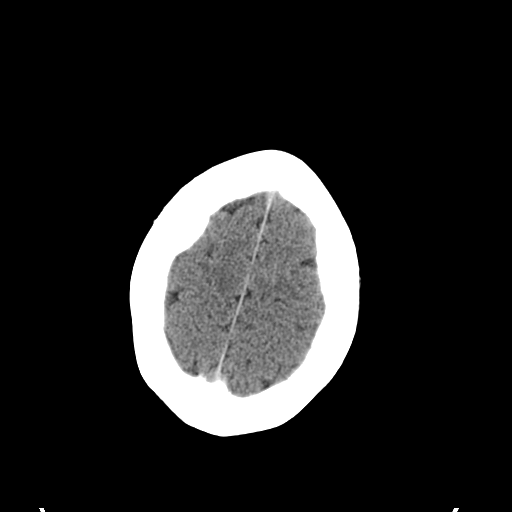
[im 23/30  bone]
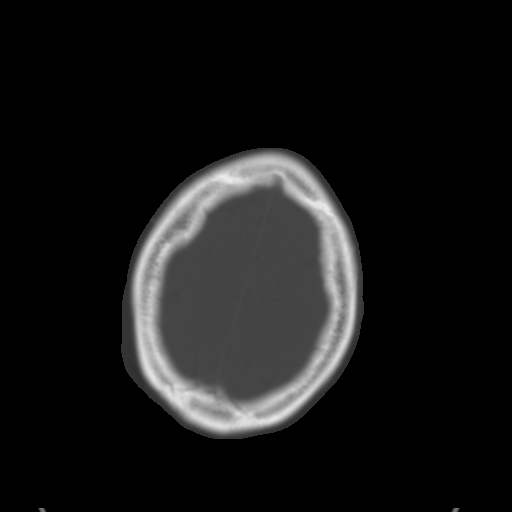
[im 25/30  brain]
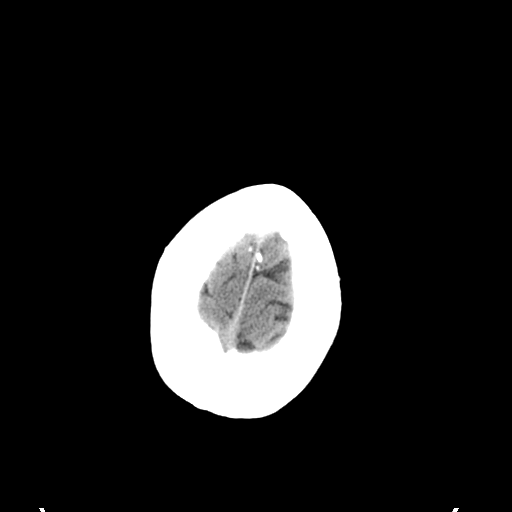
[im 27/30  brain]
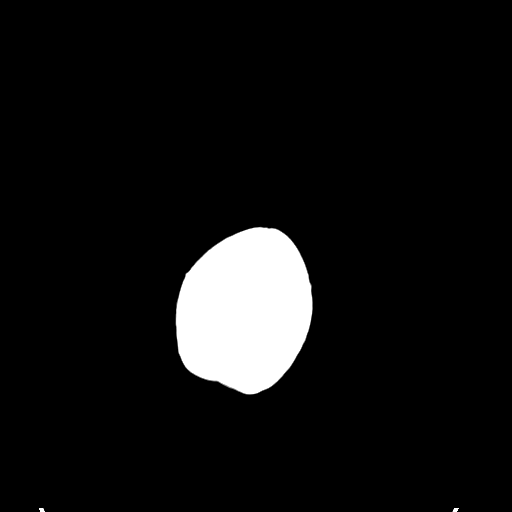
[im 29/30  brain]
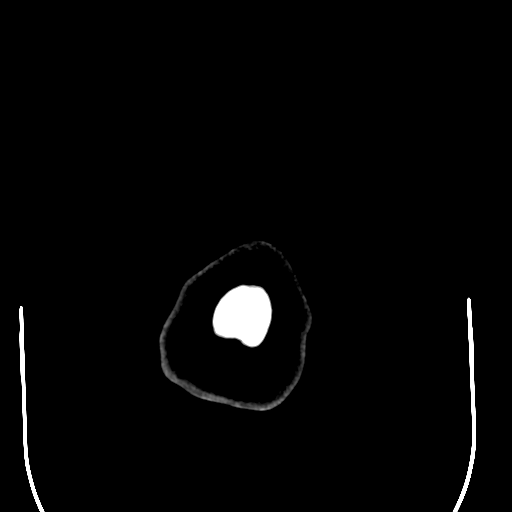

[16 of 30 positions shown; findings below may reference images not displayed]

FINDINGS: Ventricles and sulci appear symmetrical. No ventricular dilatation.
No mass effect or midline shift. No abnormal extra-axial fluid
collections. Gray-white matter junctions are distinct. Basal
cisterns are not effaced. No evidence of acute intracranial
hemorrhage. No depressed skull fractures. Visualized paranasal
sinuses and mastoid air cells are not opacified.
IMPRESSION: No acute intracranial abnormalities.

## 2017-01-20 DIAGNOSIS — N898 Other specified noninflammatory disorders of vagina: Secondary | ICD-10-CM | POA: Diagnosis not present

## 2017-01-20 DIAGNOSIS — N760 Acute vaginitis: Secondary | ICD-10-CM | POA: Diagnosis not present

## 2017-05-10 DIAGNOSIS — N898 Other specified noninflammatory disorders of vagina: Secondary | ICD-10-CM | POA: Diagnosis not present

## 2017-05-10 DIAGNOSIS — R309 Painful micturition, unspecified: Secondary | ICD-10-CM | POA: Diagnosis not present

## 2017-08-30 DIAGNOSIS — M545 Low back pain: Secondary | ICD-10-CM | POA: Diagnosis not present

## 2017-08-30 DIAGNOSIS — Z5321 Procedure and treatment not carried out due to patient leaving prior to being seen by health care provider: Secondary | ICD-10-CM | POA: Diagnosis not present

## 2017-10-07 DIAGNOSIS — N939 Abnormal uterine and vaginal bleeding, unspecified: Secondary | ICD-10-CM | POA: Diagnosis not present

## 2017-10-07 DIAGNOSIS — N938 Other specified abnormal uterine and vaginal bleeding: Secondary | ICD-10-CM | POA: Diagnosis not present

## 2017-10-20 DIAGNOSIS — R69 Illness, unspecified: Secondary | ICD-10-CM | POA: Diagnosis not present

## 2018-04-21 DIAGNOSIS — N390 Urinary tract infection, site not specified: Secondary | ICD-10-CM | POA: Diagnosis not present

## 2018-06-15 DIAGNOSIS — M545 Low back pain: Secondary | ICD-10-CM | POA: Diagnosis not present

## 2018-06-15 DIAGNOSIS — M549 Dorsalgia, unspecified: Secondary | ICD-10-CM | POA: Diagnosis not present

## 2018-06-15 DIAGNOSIS — N390 Urinary tract infection, site not specified: Secondary | ICD-10-CM | POA: Diagnosis not present

## 2020-06-07 ENCOUNTER — Telehealth: Payer: Self-pay | Admitting: Obstetrics & Gynecology

## 2020-06-07 NOTE — Telephone Encounter (Signed)
Referral received by fax from [facility], - faxed back due to we are not in network with pt's [employer] shredded

## 2023-03-01 ENCOUNTER — Ambulatory Visit
Admission: EM | Admit: 2023-03-01 | Discharge: 2023-03-01 | Disposition: A | Discharge status: Home / Self Care | Payer: Managed Care, Other (non HMO) | Attending: Nurse Practitioner | Admitting: Nurse Practitioner

## 2023-03-01 DIAGNOSIS — Z113 Encounter for screening for infections with a predominantly sexual mode of transmission: Secondary | ICD-10-CM | POA: Diagnosis not present

## 2023-03-01 DIAGNOSIS — N898 Other specified noninflammatory disorders of vagina: Secondary | ICD-10-CM | POA: Insufficient documentation

## 2023-03-01 LAB — POCT URINALYSIS DIP (MANUAL ENTRY)
Bilirubin, UA: NEGATIVE
Glucose, UA: NEGATIVE mg/dL
Leukocytes, UA: NEGATIVE
Nitrite, UA: NEGATIVE
Spec Grav, UA: 1.025 (ref 1.010–1.025)
Urobilinogen, UA: 0.2 U/dL
pH, UA: 7 (ref 5.0–8.0)

## 2023-03-01 MED ORDER — FLUCONAZOLE 150 MG PO TABS: ORAL_TABLET | ORAL | 0 refills | Status: AC

## 2023-03-01 MED ORDER — FLUCONAZOLE 150 MG PO TABS: ORAL_TABLET | ORAL | 0 refills | Status: DC

## 2023-03-01 NOTE — ED Triage Notes (Signed)
Pt c/o UTI, burning with urination, vaginal irritation, lower back pain, x 3 days

## 2023-03-01 NOTE — ED Provider Notes (Signed)
[facility]    CSN: [id number] Arrival date & time: 03/01/23  1754      History   Chief Complaint No chief complaint on file.   HPI Kimberly Moore is a 32 y.o. female.   The history is provided by the patient.   Patient presents for complaints of vaginal itching, vaginal discharge, and low back pain has been present for the past 3 days.  Patient states the itching is constant, and appears to be worsening.  She denies vaginal odor, urinary frequency, urgency, hematuria, flank pain, or lower abdominal pain.  Last menstrual cycle 02/14/2023.  Patient is concerned for an STI.  She reports 1 female partner in the past 90 days.  Past Medical History:  Diagnosis Date   Medical history non-contributory     Patient Active Problem List   Diagnosis Date Noted   Indication for care in labor and delivery, antepartum 04/28/2016    Past Surgical History:  Procedure Laterality Date   FOOT SURGERY Left     OB History     Gravida  2   Para  1   Term  1   Preterm  0   AB  0   Living  1      SAB      IAB      Ectopic      Multiple      Live Births               Home Medications    Prior to Admission medications   Medication Sig Start Date End Date Taking? Authorizing Provider  acetaminophen (TYLENOL) 325 MG tablet Take 325 mg by mouth every 4 (four) hours as needed for mild pain.    [provider]  cephALEXin (KEFLEX) 500 MG capsule Take 1 capsule (500 mg total) by mouth 3 (three) times daily. 09/07/16   Horton, Mayer Masker, MD  fluconazole (DIFLUCAN) 150 MG tablet Take 1 tablet by mouth today.  May repeat in 72 hours if symptoms continue to persist. 03/01/23   Leath-Warren, Sadie Haber, NP  Prenatal Vit-Fe Fumarate-FA (MULTIVITAMIN-PRENATAL) 27-0.8 MG TABS tablet Take 1 tablet by mouth daily at 12 noon.    [provider]    Family History History reviewed. No pertinent family history.  Social History Social History    Tobacco Use   Smoking status: Never   Smokeless tobacco: Never  Vaping Use   Vaping status: Never Used  Substance Use Topics   Alcohol use: No   Drug use: No     Allergies   Patient has no known allergies.   Review of Systems Review of Systems Per HPI  Physical Exam Triage Vital Signs ED Triage Vitals  Encounter Vitals Group     BP 03/01/23 1835 138/86     Systolic BP Percentile --      Diastolic BP Percentile --      Pulse Rate 03/01/23 1835 74     Resp 03/01/23 1835 15     Temp 03/01/23 1835 98.1 F (36.7 C)     Temp src --      SpO2 03/01/23 1835 98 %     Weight --      Height --      Head Circumference --      Peak Flow --      Pain Score 03/01/23 1838 3     Pain Loc --      Pain Education --  Exclude from Growth Chart --    No data found.  Updated Vital Signs BP 138/86 (BP Location: Right Arm)   Pulse 74   Temp 98.1 F (36.7 C)   Resp 15   LMP 02/14/2023 (Exact Date)   SpO2 98%   Breastfeeding No   Visual Acuity Right Eye Distance:   Left Eye Distance:   Bilateral Distance:    Right Eye Near:   Left Eye Near:    Bilateral Near:     Physical Exam Vitals and nursing note reviewed.  Constitutional:      General: She is not in acute distress.    Appearance: Normal appearance.  HENT:     Head: Normocephalic.  Eyes:     Extraocular Movements: Extraocular movements intact.     Pupils: Pupils are equal, round, and reactive to light.  Cardiovascular:     Rate and Rhythm: Normal rate and regular rhythm.     Pulses: Normal pulses.     Heart sounds: Normal heart sounds.  Pulmonary:     Effort: Pulmonary effort is normal.     Breath sounds: Normal breath sounds.  Abdominal:     General: Bowel sounds are normal.     Palpations: Abdomen is soft.     Tenderness: There is no abdominal tenderness. There is no right CVA tenderness or left CVA tenderness.  Musculoskeletal:     Cervical back: Normal range of motion.  Skin:    General: Skin  is warm and dry.  Neurological:     General: No focal deficit present.     Mental Status: She is alert and oriented to person, place, and time.  Psychiatric:        Mood and Affect: Mood normal.        Behavior: Behavior normal.      UC Treatments / Results  Labs (all labs ordered are listed, but only abnormal results are displayed) Labs Reviewed  POCT URINALYSIS DIP (MANUAL ENTRY) - Abnormal; Notable for the following components:      Result Value   Ketones, POC UA small (15) (*)    Blood, UA trace-intact (*)    Protein Ur, POC trace (*)    All other components within normal limits  URINE CULTURE  CERVICOVAGINAL ANCILLARY ONLY    EKG   Radiology No results found.  Procedures Procedures (including critical care time)  Medications Ordered in UC Medications - No data to display  Initial Impression / Assessment and Plan / UC Course  I have reviewed the triage vital signs and the nursing notes.  Pertinent labs & imaging results that were available during my care of the patient were reviewed by me and considered in my medical decision making (see chart for details).     *** Final Clinical Impressions(s) / UC Diagnoses   Final diagnoses:  Vaginal itching  Vaginal discharge  Screening examination for sexually transmitted disease     Discharge Instructions      Urine culture and cytology swabs are pending.  You will be contacted if the pending test results are abnormal.  You will also have access to the results via MyChart. Take medication as prescribed. Increase fluids and allow for plenty of rest. Increase condom use with each sexual encounter. If your pending test results are positive for a sexually transmitted disease and you require treatment, please refrain from sexual activity for 7 days after completing treatment. Please notify all partners if you are test results are positive for a  sexually transmitted disease. May use over-the-counter boric acid  vaginal suppositories for reoccurring BV and yeast.  Apply 1 suppository vaginally at bedtime for 1 week, then weekly thereafter. Follow-up as needed.     ED Prescriptions     Medication Sig Dispense Auth. Provider   fluconazole (DIFLUCAN) 150 MG tablet  (Status: Discontinued) Take 1 tablet by mouth today.  May repeat in 72 hours if symptoms continue to persist. 2 tablet Leath-Warren, Sadie Haber, NP   fluconazole (DIFLUCAN) 150 MG tablet Take 1 tablet by mouth today.  May repeat in 72 hours if symptoms continue to persist. 2 tablet Leath-Warren, Sadie Haber, NP      PDMP not reviewed this encounter.

## 2023-03-01 NOTE — Discharge Instructions (Addendum)
Urine culture and cytology swabs are pending.  You will be contacted if the pending test results are abnormal.  You will also have access to the results via MyChart. Take medication as prescribed. Increase fluids and allow for plenty of rest. Increase condom use with each sexual encounter. If your pending test results are positive for a sexually transmitted disease and you require treatment, please refrain from sexual activity for 7 days after completing treatment. Please notify all partners if you are test results are positive for a sexually transmitted disease. May use over-the-counter boric acid vaginal suppositories for reoccurring BV and yeast.  Apply 1 suppository vaginally at bedtime for 1 week, then weekly thereafter. Follow-up as needed.

## 2023-03-02 LAB — CERVICOVAGINAL ANCILLARY ONLY
Bacterial Vaginitis (gardnerella): NEGATIVE
Candida Glabrata: NEGATIVE
Candida Vaginitis: POSITIVE — AB
Chlamydia: NEGATIVE
Comment: NEGATIVE
Comment: NEGATIVE
Comment: NEGATIVE
Comment: NEGATIVE
Comment: NEGATIVE
Comment: NORMAL
Neisseria Gonorrhea: NEGATIVE
Trichomonas: NEGATIVE

## 2024-02-27 ENCOUNTER — Ambulatory Visit
Admission: EM | Admit: 2024-02-27 | Discharge: 2024-02-27 | Disposition: A | Discharge status: Home / Self Care | Attending: Family Medicine | Admitting: Family Medicine

## 2024-02-27 ENCOUNTER — Other Ambulatory Visit: Payer: Self-pay

## 2024-02-27 ENCOUNTER — Encounter: Payer: Self-pay | Admitting: Emergency Medicine

## 2024-02-27 DIAGNOSIS — L989 Disorder of the skin and subcutaneous tissue, unspecified: Secondary | ICD-10-CM | POA: Diagnosis not present

## 2024-02-27 DIAGNOSIS — H65191 Other acute nonsuppurative otitis media, right ear: Secondary | ICD-10-CM

## 2024-02-27 DIAGNOSIS — L509 Urticaria, unspecified: Secondary | ICD-10-CM

## 2024-02-27 DIAGNOSIS — H9311 Tinnitus, right ear: Secondary | ICD-10-CM

## 2024-02-27 MED ORDER — TRIAMCINOLONE ACETONIDE 0.1 % EX CREA: 1.0000 | TOPICAL_CREAM | Freq: Two times a day (BID) | CUTANEOUS | 0 refills | Status: AC

## 2024-02-27 MED ORDER — AZELASTINE HCL 0.1 % NA SOLN: 1.0000 | Freq: Two times a day (BID) | NASAL | 0 refills | Status: AC

## 2024-02-27 MED ORDER — CETIRIZINE HCL 10 MG PO TABS: 10.0000 mg | ORAL_TABLET | Freq: Every day | ORAL | 2 refills | Status: AC

## 2024-02-27 NOTE — Discharge Instructions (Signed)
 I prescribed Astelin nasal spray to help with the inner ear pressure that may be contributing to the ringing in your ear.  I have prescribed some steroid cream to apply to the hard bumps that keep popping up to see if this helps.  Try the Zyrtec daily for at least 2 weeks to see if this resolves your hives issues.  Follow-up with your primary care provider for recheck.

## 2024-02-27 NOTE — ED Triage Notes (Signed)
 Pt reports right ear ringing x3 days.   Pt also reports intermittent rash,fatigue, general malaise that gets worse at night x2 weeks. Pt reports its only on my belly, hands, back, and left leg. Reports hx of similar as a child. Has taken allergy medication daily and benadryl. Denies any changes in medication or self care products.

## 2024-03-01 NOTE — ED Provider Notes (Signed)
 [facility]    CSN: [id number] Arrival date & time: 02/27/24  0915      History   Chief Complaint Chief Complaint  Patient presents with   Ear Pain    HPI Kimberly Moore is a 33 y.o. female.   Presenting today with 3 day history of right ear ringing and pressure, and 2 week history of rash mainly at night. States areas become itchy and when she scratches the areas hives come up. Denies new foods or medications, new hygiene products or outdoor exposures, chest tightness, throat itching or swelling, CP, SOB. So far tried benadryl with minimal relief.     Past Medical History:  Diagnosis Date   Medical history non-contributory     Patient Active Problem List   Diagnosis Date Noted   Indication for care in labor and delivery, antepartum 04/28/2016    Past Surgical History:  Procedure Laterality Date   FOOT SURGERY Left     OB History     Gravida  2   Para  1   Term  1   Preterm  0   AB  0   Living  1      SAB      IAB      Ectopic      Multiple      Live Births               Home Medications    Prior to Admission medications   Medication Sig Start Date End Date Taking? Authorizing Provider  azelastine (ASTELIN) 0.1 % nasal spray Place 1 spray into both nostrils 2 (two) times daily. Use in each nostril as directed 02/27/24  Yes Stuart Vernell Norris, PA-C  cetirizine (ZYRTEC ALLERGY) 10 MG tablet Take 1 tablet (10 mg total) by mouth daily. 02/27/24  Yes Stuart Vernell Norris, PA-C  triamcinolone  cream (KENALOG) 0.1 % Apply 1 Application topically 2 (two) times daily. 02/27/24  Yes Stuart Vernell Norris, PA-C  acetaminophen  (TYLENOL ) 325 MG tablet Take 325 mg by mouth every 4 (four) hours as needed for mild pain.    [provider]  cephALEXin  (KEFLEX ) 500 MG capsule Take 1 capsule (500 mg total) by mouth 3 (three) times daily. 09/07/16   Horton, Charmaine FALCON, MD  fluconazole  (DIFLUCAN ) 150 MG tablet Take 1 tablet by  mouth today.  May repeat in 72 hours if symptoms continue to persist. 03/01/23   Leath-Warren, Etta PARAS, NP  Prenatal Vit-Fe Fumarate-FA (MULTIVITAMIN-PRENATAL) 27-0.8 MG TABS tablet Take 1 tablet by mouth daily at 12 noon.    [provider]    Family History History reviewed. No pertinent family history.  Social History Social History   Tobacco Use   Smoking status: Never   Smokeless tobacco: Never  Vaping Use   Vaping status: Never Used  Substance Use Topics   Alcohol use: No   Drug use: No     Allergies   Patient has no known allergies.   Review of Systems Review of Systems PER HPI  Physical Exam Triage Vital Signs ED Triage Vitals  Encounter Vitals Group     BP 02/27/24 0938 127/89     Girls Systolic BP Percentile --      Girls Diastolic BP Percentile --      Boys Systolic BP Percentile --      Boys Diastolic BP Percentile --      Pulse Rate 02/27/24 0938 80     Resp 02/27/24 0938 20  Temp 02/27/24 0938 99.1 F (37.3 C)     Temp Source 02/27/24 0938 Oral     SpO2 02/27/24 0938 100 %     Weight --      Height --      Head Circumference --      Peak Flow --      Pain Score 02/27/24 0936 2     Pain Loc --      Pain Education --      Exclude from Growth Chart --    No data found.  Updated Vital Signs BP 127/89 (BP Location: Right Arm)   Pulse 80   Temp 99.1 F (37.3 C) (Oral)   Resp 20   LMP 02/20/2024 (Approximate)   SpO2 100%   Visual Acuity Right Eye Distance:   Left Eye Distance:   Bilateral Distance:    Right Eye Near:   Left Eye Near:    Bilateral Near:     Physical Exam Vitals and nursing note reviewed.  Constitutional:      Appearance: Normal appearance. She is not ill-appearing.  HENT:     Head: Atraumatic.     Ears:     Comments: Trace right middle ear effusion Eyes:     Extraocular Movements: Extraocular movements intact.     Conjunctiva/sclera: Conjunctivae normal.  Cardiovascular:     Rate and Rhythm:  Normal rate and regular rhythm.     Heart sounds: Normal heart sounds.  Pulmonary:     Effort: Pulmonary effort is normal.     Breath sounds: Normal breath sounds.  Musculoskeletal:        General: Normal range of motion.     Cervical back: Normal range of motion and neck supple.  Skin:    General: Skin is warm and dry.     Comments: Scabbed small hyperpigmented lesions scattered rarely, including one on left hand and one on abdomen. Photos show linear hives lesions appearing intermittently, though none currently  Neurological:     Mental Status: She is alert and oriented to person, place, and time.  Psychiatric:        Mood and Affect: Mood normal.        Thought Content: Thought content normal.        Judgment: Judgment normal.      UC Treatments / Results  Labs (all labs ordered are listed, but only abnormal results are displayed) Labs Reviewed - No data to display  EKG   Radiology No results found.  Procedures Procedures (including critical care time)  Medications Ordered in UC Medications - No data to display  Initial Impression / Assessment and Plan / UC Course  I have reviewed the triage vital signs and the nursing notes.  Pertinent labs & imaging results that were available during my care of the patient were reviewed by me and considered in my medical decision making (see chart for details).     Trial astelin for tinnitus, and start zyrtec daily for hives and avoid scented soaps or products. Follow up with PCP if not resolving. Triamcinolone  cream prn to skin lesions that appear inflammatory. Return for worsening or unresolving sxs.  Final Clinical Impressions(s) / UC Diagnoses   Final diagnoses:  Hives  Skin lesion  Acute MEE (middle ear effusion), right  Tinnitus of right ear     Discharge Instructions      I prescribed Astelin nasal spray to help with the inner ear pressure that may be contributing to the ringing  in your ear.  I have prescribed  some steroid cream to apply to the hard bumps that keep popping up to see if this helps.  Try the Zyrtec daily for at least 2 weeks to see if this resolves your hives issues.  Follow-up with your primary care provider for recheck.    ED Prescriptions     Medication Sig Dispense Auth. Provider   azelastine (ASTELIN) 0.1 % nasal spray Place 1 spray into both nostrils 2 (two) times daily. Use in each nostril as directed 30 mL Stuart Vernell Norris, PA-C   cetirizine (ZYRTEC ALLERGY) 10 MG tablet Take 1 tablet (10 mg total) by mouth daily. 30 tablet Stuart Vernell Norris, PA-C   triamcinolone  cream (KENALOG) 0.1 % Apply 1 Application topically 2 (two) times daily. 80 g Stuart Vernell Norris, [place]      PDMP not reviewed this encounter.   Stuart Vernell [place], [place] 03/01/24 [phone]
